# Patient Record
Sex: Female | Born: 1935 | Race: White | Hispanic: Yes | Marital: Single | Smoking: Never smoker
Health system: Southern US, Community
[De-identification: ages and names within clinical notes are randomized; demographics above are authoritative.]

## PROBLEM LIST (undated history)

## (undated) DIAGNOSIS — E119 Type 2 diabetes mellitus without complications: Secondary | ICD-10-CM

## (undated) DIAGNOSIS — D649 Anemia, unspecified: Secondary | ICD-10-CM

## (undated) DIAGNOSIS — M1990 Unspecified osteoarthritis, unspecified site: Secondary | ICD-10-CM

## (undated) DIAGNOSIS — J45909 Unspecified asthma, uncomplicated: Secondary | ICD-10-CM

## (undated) DIAGNOSIS — I10 Essential (primary) hypertension: Secondary | ICD-10-CM

## (undated) DIAGNOSIS — E079 Disorder of thyroid, unspecified: Secondary | ICD-10-CM

## (undated) DIAGNOSIS — Z8489 Family history of other specified conditions: Secondary | ICD-10-CM

## (undated) DIAGNOSIS — K8020 Calculus of gallbladder without cholecystitis without obstruction: Secondary | ICD-10-CM

## (undated) DIAGNOSIS — N289 Disorder of kidney and ureter, unspecified: Secondary | ICD-10-CM

## (undated) DIAGNOSIS — N200 Calculus of kidney: Secondary | ICD-10-CM

## (undated) HISTORY — PX: ABDOMINAL HYSTERECTOMY: SHX81

## (undated) HISTORY — PX: HERNIA REPAIR: SHX51

## (undated) HISTORY — PX: CATARACT EXTRACTION: SUR2

## (undated) HISTORY — PX: CHOLECYSTECTOMY: SHX55

---

## 1998-11-25 ENCOUNTER — Other Ambulatory Visit: Admission: RE | Admit: 1998-11-25 | Discharge: 1998-11-25 | Payer: Self-pay | Admitting: Endocrinology

## 2000-04-29 ENCOUNTER — Emergency Department (HOSPITAL_COMMUNITY): Admission: EM | Admit: 2000-04-29 | Discharge: 2000-04-30 | Payer: Self-pay | Admitting: Emergency Medicine

## 2000-04-30 ENCOUNTER — Encounter: Payer: Self-pay | Admitting: Emergency Medicine

## 2000-11-15 ENCOUNTER — Emergency Department (HOSPITAL_COMMUNITY): Admission: EM | Admit: 2000-11-15 | Discharge: 2000-11-15 | Payer: Self-pay | Admitting: Emergency Medicine

## 2001-01-17 ENCOUNTER — Encounter (INDEPENDENT_AMBULATORY_CARE_PROVIDER_SITE_OTHER): Payer: Self-pay | Admitting: Specialist

## 2001-01-17 ENCOUNTER — Ambulatory Visit (HOSPITAL_COMMUNITY): Admission: RE | Admit: 2001-01-17 | Discharge: 2001-01-18 | Payer: Self-pay | Admitting: Ophthalmology

## 2001-01-17 ENCOUNTER — Encounter: Payer: Self-pay | Admitting: Ophthalmology

## 2001-08-30 ENCOUNTER — Emergency Department (HOSPITAL_COMMUNITY): Admission: EM | Admit: 2001-08-30 | Discharge: 2001-08-30 | Payer: Self-pay | Admitting: Emergency Medicine

## 2001-08-30 ENCOUNTER — Encounter: Payer: Self-pay | Admitting: Emergency Medicine

## 2001-11-28 ENCOUNTER — Emergency Department (HOSPITAL_COMMUNITY): Admission: EM | Admit: 2001-11-28 | Discharge: 2001-11-28 | Payer: Self-pay

## 2001-12-17 ENCOUNTER — Emergency Department (HOSPITAL_COMMUNITY): Admission: EM | Admit: 2001-12-17 | Discharge: 2001-12-18 | Payer: Self-pay | Admitting: *Deleted

## 2002-11-27 ENCOUNTER — Encounter: Admission: RE | Admit: 2002-11-27 | Discharge: 2002-11-27 | Payer: Self-pay | Admitting: Endocrinology

## 2002-11-27 ENCOUNTER — Encounter: Payer: Self-pay | Admitting: Endocrinology

## 2003-04-26 ENCOUNTER — Ambulatory Visit (HOSPITAL_COMMUNITY): Admission: RE | Admit: 2003-04-26 | Discharge: 2003-04-26 | Payer: Self-pay | Admitting: General Surgery

## 2003-04-26 ENCOUNTER — Ambulatory Visit (HOSPITAL_BASED_OUTPATIENT_CLINIC_OR_DEPARTMENT_OTHER): Admission: RE | Admit: 2003-04-26 | Discharge: 2003-04-26 | Payer: Self-pay | Admitting: General Surgery

## 2003-07-17 ENCOUNTER — Emergency Department (HOSPITAL_COMMUNITY): Admission: EM | Admit: 2003-07-17 | Discharge: 2003-07-17 | Payer: Self-pay | Admitting: Emergency Medicine

## 2003-07-26 ENCOUNTER — Encounter (INDEPENDENT_AMBULATORY_CARE_PROVIDER_SITE_OTHER): Payer: Self-pay | Admitting: Specialist

## 2003-07-26 ENCOUNTER — Encounter: Admission: RE | Admit: 2003-07-26 | Discharge: 2003-07-26 | Payer: Self-pay | Admitting: Endocrinology

## 2003-09-30 ENCOUNTER — Encounter: Admission: RE | Admit: 2003-09-30 | Discharge: 2003-09-30 | Payer: Self-pay | Admitting: General Surgery

## 2003-10-01 ENCOUNTER — Ambulatory Visit (HOSPITAL_COMMUNITY): Admission: RE | Admit: 2003-10-01 | Discharge: 2003-10-01 | Payer: Self-pay | Admitting: General Surgery

## 2003-10-01 ENCOUNTER — Ambulatory Visit (HOSPITAL_BASED_OUTPATIENT_CLINIC_OR_DEPARTMENT_OTHER): Admission: RE | Admit: 2003-10-01 | Discharge: 2003-10-01 | Payer: Self-pay | Admitting: General Surgery

## 2003-10-01 ENCOUNTER — Encounter (INDEPENDENT_AMBULATORY_CARE_PROVIDER_SITE_OTHER): Payer: Self-pay | Admitting: *Deleted

## 2005-05-24 ENCOUNTER — Encounter: Admission: RE | Admit: 2005-05-24 | Discharge: 2005-05-24 | Payer: Self-pay | Admitting: Nephrology

## 2005-12-01 ENCOUNTER — Emergency Department (HOSPITAL_COMMUNITY): Admission: EM | Admit: 2005-12-01 | Discharge: 2005-12-02 | Payer: Self-pay | Admitting: Emergency Medicine

## 2009-01-23 ENCOUNTER — Emergency Department (HOSPITAL_COMMUNITY): Admission: EM | Admit: 2009-01-23 | Discharge: 2009-01-23 | Payer: Self-pay | Admitting: Emergency Medicine

## 2009-02-20 ENCOUNTER — Emergency Department (HOSPITAL_COMMUNITY): Admission: EM | Admit: 2009-02-20 | Discharge: 2009-02-20 | Payer: Self-pay | Admitting: Emergency Medicine

## 2010-08-01 ENCOUNTER — Encounter: Payer: Self-pay | Admitting: Orthopaedic Surgery

## 2010-08-02 ENCOUNTER — Encounter: Payer: Self-pay | Admitting: Orthopaedic Surgery

## 2010-08-04 ENCOUNTER — Emergency Department (HOSPITAL_COMMUNITY)
Admission: EM | Admit: 2010-08-04 | Discharge: 2010-08-04 | Payer: Self-pay | Source: Home / Self Care | Admitting: Emergency Medicine

## 2010-08-05 LAB — CBC
HCT: 36.7 % (ref 36.0–46.0)
Hemoglobin: 12.4 g/dL (ref 12.0–15.0)
MCH: 29.6 pg (ref 26.0–34.0)
MCHC: 33.8 g/dL (ref 30.0–36.0)
MCV: 87.6 fL (ref 78.0–100.0)
Platelets: 336 10*3/uL (ref 150–400)
RBC: 4.19 MIL/uL (ref 3.87–5.11)
RDW: 13.4 % (ref 11.5–15.5)
WBC: 15.1 10*3/uL — ABNORMAL HIGH (ref 4.0–10.5)

## 2010-08-05 LAB — URINALYSIS, ROUTINE W REFLEX MICROSCOPIC
Bilirubin Urine: NEGATIVE
Hgb urine dipstick: NEGATIVE
Ketones, ur: NEGATIVE mg/dL
Nitrite: NEGATIVE
Protein, ur: NEGATIVE mg/dL
Specific Gravity, Urine: 1.004 — ABNORMAL LOW (ref 1.005–1.030)
Urine Glucose, Fasting: NEGATIVE mg/dL
Urobilinogen, UA: 0.2 mg/dL (ref 0.0–1.0)
pH: 7 (ref 5.0–8.0)

## 2010-08-05 LAB — COMPREHENSIVE METABOLIC PANEL
ALT: 13 U/L (ref 0–35)
AST: 22 U/L (ref 0–37)
Albumin: 3.8 g/dL (ref 3.5–5.2)
Alkaline Phosphatase: 59 U/L (ref 39–117)
BUN: 34 mg/dL — ABNORMAL HIGH (ref 6–23)
CO2: 27 mEq/L (ref 19–32)
Calcium: 9.3 mg/dL (ref 8.4–10.5)
Chloride: 92 mEq/L — ABNORMAL LOW (ref 96–112)
Creatinine, Ser: 1.55 mg/dL — ABNORMAL HIGH (ref 0.4–1.2)
GFR calc Af Amer: 39 mL/min — ABNORMAL LOW (ref >60)
GFR calc non Af Amer: 32 mL/min — ABNORMAL LOW (ref >60)
Glucose, Bld: 146 mg/dL — ABNORMAL HIGH (ref 70–99)
Potassium: 4.2 mEq/L (ref 3.5–5.1)
Sodium: 129 mEq/L — ABNORMAL LOW (ref 135–145)
Total Bilirubin: 1 mg/dL (ref 0.3–1.2)
Total Protein: 7.4 g/dL (ref 6.0–8.3)

## 2010-08-05 LAB — URINE MICROSCOPIC-ADD ON

## 2010-08-05 LAB — DIFFERENTIAL
Basophils Absolute: 0 10*3/uL (ref 0.0–0.1)
Basophils Relative: 0 % (ref 0–1)
Eosinophils Absolute: 0.1 10*3/uL (ref 0.0–0.7)
Eosinophils Relative: 0 % (ref 0–5)
Lymphocytes Relative: 16 % (ref 12–46)
Lymphs Abs: 2.4 10*3/uL (ref 0.7–4.0)
Monocytes Absolute: 1.6 10*3/uL — ABNORMAL HIGH (ref 0.1–1.0)
Monocytes Relative: 11 % (ref 3–12)
Neutro Abs: 10.9 10*3/uL — ABNORMAL HIGH (ref 1.7–7.7)
Neutrophils Relative %: 72 % (ref 43–77)

## 2010-08-05 LAB — POCT CARDIAC MARKERS
CKMB, poc: <1 ng/mL — ABNORMAL LOW (ref 1.0–8.0)
Myoglobin, poc: 201 ng/mL (ref 12–200)
Troponin i, poc: 0.1 ng/mL — ABNORMAL HIGH (ref 0.00–0.09)

## 2010-08-05 LAB — CK TOTAL AND CKMB (NOT AT ARMC)
CK, MB: 1.8 ng/mL (ref 0.3–4.0)
Relative Index: INVALID (ref 0.0–2.5)
Total CK: 69 U/L (ref 7–177)

## 2010-08-05 LAB — LIPASE, BLOOD: Lipase: 20 U/L (ref 11–59)

## 2010-08-05 LAB — TROPONIN I: Troponin I: 0.02 ng/mL (ref 0.00–0.06)

## 2010-10-17 LAB — COMPREHENSIVE METABOLIC PANEL
ALT: 9 U/L (ref 0–35)
AST: 42 U/L — ABNORMAL HIGH (ref 0–37)
Albumin: 4 g/dL (ref 3.5–5.2)
Alkaline Phosphatase: 58 U/L (ref 39–117)
BUN: 27 mg/dL — ABNORMAL HIGH (ref 6–23)
CO2: 26 mEq/L (ref 19–32)
Calcium: 8.8 mg/dL (ref 8.4–10.5)
Chloride: 99 mEq/L (ref 96–112)
Creatinine, Ser: 1.15 mg/dL (ref 0.4–1.2)
GFR calc Af Amer: 55 mL/min — ABNORMAL LOW (ref >60)
GFR calc non Af Amer: 45 mL/min — ABNORMAL LOW (ref >60)
Glucose, Bld: 195 mg/dL — ABNORMAL HIGH (ref 70–99)
Potassium: 4.1 mEq/L (ref 3.5–5.1)
Sodium: 135 mEq/L (ref 135–145)
Total Bilirubin: 1.5 mg/dL — ABNORMAL HIGH (ref 0.3–1.2)
Total Protein: 7.3 g/dL (ref 6.0–8.3)

## 2010-10-17 LAB — URINE MICROSCOPIC-ADD ON

## 2010-10-17 LAB — DIFFERENTIAL
Basophils Absolute: 0 10*3/uL (ref 0.0–0.1)
Basophils Relative: 0 % (ref 0–1)
Eosinophils Absolute: 0.1 10*3/uL (ref 0.0–0.7)
Eosinophils Relative: 1 % (ref 0–5)
Lymphocytes Relative: 7 % — ABNORMAL LOW (ref 12–46)
Lymphs Abs: 0.7 10*3/uL (ref 0.7–4.0)
Monocytes Absolute: 0.7 10*3/uL (ref 0.1–1.0)
Monocytes Relative: 7 % (ref 3–12)
Neutro Abs: 8.8 10*3/uL — ABNORMAL HIGH (ref 1.7–7.7)
Neutrophils Relative %: 85 % — ABNORMAL HIGH (ref 43–77)

## 2010-10-17 LAB — URINALYSIS, ROUTINE W REFLEX MICROSCOPIC
Bilirubin Urine: NEGATIVE
Glucose, UA: NEGATIVE mg/dL
Ketones, ur: NEGATIVE mg/dL
Nitrite: NEGATIVE
Protein, ur: 100 mg/dL — AB
Specific Gravity, Urine: 1.014 (ref 1.005–1.030)
Urobilinogen, UA: 0.2 mg/dL (ref 0.0–1.0)
pH: 6 (ref 5.0–8.0)

## 2010-10-17 LAB — URINE CULTURE: Colony Count: >=100000

## 2010-10-17 LAB — CBC
HCT: 37.8 % (ref 36.0–46.0)
Hemoglobin: 12.8 g/dL (ref 12.0–15.0)
MCHC: 33.8 g/dL (ref 30.0–36.0)
MCV: 88.2 fL (ref 78.0–100.0)
Platelets: 351 10*3/uL (ref 150–400)
RBC: 4.29 MIL/uL (ref 3.87–5.11)
RDW: 14.5 % (ref 11.5–15.5)
WBC: 10.3 10*3/uL (ref 4.0–10.5)

## 2010-10-17 LAB — LIPASE, BLOOD: Lipase: 94 U/L — ABNORMAL HIGH (ref 11–59)

## 2010-10-17 LAB — GLUCOSE, CAPILLARY: Glucose-Capillary: 175 mg/dL — ABNORMAL HIGH (ref 70–99)

## 2010-10-18 LAB — URINALYSIS, ROUTINE W REFLEX MICROSCOPIC
Bilirubin Urine: NEGATIVE
Glucose, UA: NEGATIVE mg/dL
Ketones, ur: NEGATIVE mg/dL
pH: 7 (ref 5.0–8.0)

## 2010-10-18 LAB — CBC
MCV: 89.9 fL (ref 78.0–100.0)
Platelets: 332 10*3/uL (ref 150–400)
WBC: 7.9 10*3/uL (ref 4.0–10.5)

## 2010-10-18 LAB — COMPREHENSIVE METABOLIC PANEL
ALT: 14 U/L (ref 0–35)
AST: 23 U/L (ref 0–37)
Albumin: 3.8 g/dL (ref 3.5–5.2)
Chloride: 104 mEq/L (ref 96–112)
Creatinine, Ser: 1.17 mg/dL (ref 0.4–1.2)
GFR calc Af Amer: 54 mL/min — ABNORMAL LOW (ref >60)
Potassium: 4.4 mEq/L (ref 3.5–5.1)
Sodium: 139 mEq/L (ref 135–145)
Total Bilirubin: 0.5 mg/dL (ref 0.3–1.2)

## 2010-10-18 LAB — DIFFERENTIAL
Basophils Absolute: 0.1 10*3/uL (ref 0.0–0.1)
Eosinophils Relative: 3 % (ref 0–5)
Lymphocytes Relative: 32 % (ref 12–46)
Lymphs Abs: 2.5 10*3/uL (ref 0.7–4.0)
Monocytes Absolute: 0.6 10*3/uL (ref 0.1–1.0)

## 2010-10-18 LAB — URINE MICROSCOPIC-ADD ON

## 2010-10-18 LAB — GLUCOSE, CAPILLARY: Glucose-Capillary: 97 mg/dL (ref 70–99)

## 2010-11-27 NOTE — Op Note (Signed)
[facility]  Patient:    Janet [name], Janet [name]                      MRN: [id number] Proc. Date: 01/17/01 Adm. Date:  [id number] Attending:  Bertrum Sol                           Operative Report  DATE OF BIRTH:  1923-10-05.  PREOPERATIVE DIAGNOSIS:  Proliferative diabetic retinopathy with preretinal fibrosis.  PROCEDURES:  Pars plana vitrectomy, capsulectomy, retinal photocoagulation, membrane peel, left eye.  SURGEON:  Beulah Gandy. Ashley Royalty, M.D.  ASSISTANT:  _____, R.R.N.  ANESTHESIA:  General.  DESCRIPTION OF PROCEDURE:  Usual prep and drape.  Peritomies at 10, 2, and 4 oclock.  A 4 mm angled infusion port anchored into place at 4 oclock.  The lighted pick and the cutter were placed at 10 and 2 oclock, respectively. The contact lens ring was anchored into place at 6 and 12 oclock.  The pars plana vitrectomy was begun just behind the pseudophakos.  There was fibrosis of the posterior capsule, and therefore it was removed with the vitreous cutter knife.  The vitrectomy was carried posteriorly, and a large amount of vitreous was removed from the central vitreous cavity.  The vitrectomy was carried out of the far peripheral vitreous cavity with the 30 degree prismatic lens, and all vitreous was removed down to the vitreous base for 360 degrees. The attention was then carried to the macular region, where under high magnification the 27 gauge Eagle pick was used to engage a thick, white layer of posterior hyaloid and preretinal fibrosis.  This layer was lifted up and peeled in one continuous sheet across the entire macula and over the disc.  It was peeled out to the equator.  The membrane was removed from the eye and sent to pathology for study.  The Endolaser was positioned in the eye, and 912 burns were placed around the retinal periphery with a power of 500 milliwatts, 1000 microns each, and 0.1 seconds each.  A wash-out procedure was  performed. The instruments were removed from the eye, and 9-0 nylon was used to close the sclerotomy sites.  The conjunctiva was closed with wet-field cautery. Polymyxin and gentamicin were irrigated into Tenons space, atropine solution was applied, Marcaine was injected around the globe for postop pain, Decadron 10 mg was injected into the lower subconjunctival space.  Polysporin, a patch, and shield were placed.  The closing tension was less than 10 with a Barraquer tonometer.  COMPLICATIONS:  None.  DURATION:  One hour.  The patient was awakened and taken to recovery in satisfactory condition. DD:  01/17/01 TD:  01/17/01 Job: [id number]

## 2010-11-27 NOTE — Op Note (Signed)
NAMEVERNIA, TEEM                         ACCOUNT NO.:  [id number]   MEDICAL RECORD NO.:  [id number]                   PATIENT TYPE:  AMB   LOCATION:  DSC                                  FACILITY:  [facility]   PHYSICIAN:  Sharlet Salina T. Hoxworth, M.D.          DATE OF BIRTH:  17-Dec-1923   DATE OF PROCEDURE:  10/01/2003  DATE OF DISCHARGE:                                 OPERATIVE REPORT   PREOPERATIVE DIAGNOSIS:  Left breast mass.   POSTOPERATIVE DIAGNOSIS:  Left breast mass.   PROCEDURE:  Left breast biopsy.   SURGEON:  Lorne Skeens. Hoxworth, M.D.   ANESTHESIA:  Local with IV sedation.   HISTORY OF PRESENT ILLNESS:  Ms. Harrell is a 77 year old female who presented  with a palpable mass in the upper left breast.  She has had a large core  needle biopsy showing only fibrotic change but the mass has enlarged on  clinical follow up and excision has been recommended and accepted.  The  nature of the procedure, indications, risks of bleeding, infection, were  discussed and understood.  She is now brought to the operating room for this  procedure.   DESCRIPTION OF PROCEDURE:  The patient was brought to the operating room and  placed in supine position on the operating table and IV sedation was  administered.  The left breast was sterilely prepped and draped.  Local  anesthesia was used to infiltrate the skin and underlying breast tissue.  A  curvilinear incision was made directly over the mass at the 12 o'clock  position and dissection was carried down through the subcutaneous tissue to  the breast capsule.  The area of palpable abnormality which was quite firm  measuring about 2 cm in diameter was then completely sharply excised.  This  was sent for permanent pathology.  Hemostasis was obtained with the cautery.  The subcu was reapproximated with interrupted 4-0 Monocryl and the skin with  running subcuticular 4-0 Monocryl and Steri-Strips.  Sponge, needle, and  instrument counts were  correct.  Dry, sterile dressings were applied.  The  patient was taken to the recovery room in good condition.                                               Lorne Skeens. Hoxworth, M.D.    Tory Emerald  D:  10/01/2003  T:  10/01/2003  Job:  [id number]

## 2010-12-29 ENCOUNTER — Observation Stay (HOSPITAL_COMMUNITY)
Admission: AD | Admit: 2010-12-29 | Discharge: 2010-12-30 | Disposition: A | Discharge status: Home / Self Care | Payer: Medicare PPO | Source: Ambulatory Visit | Attending: Internal Medicine | Admitting: Internal Medicine

## 2010-12-29 ENCOUNTER — Inpatient Hospital Stay (HOSPITAL_COMMUNITY): Payer: Medicare PPO

## 2010-12-29 DIAGNOSIS — Z23 Encounter for immunization: Secondary | ICD-10-CM | POA: Insufficient documentation

## 2010-12-29 DIAGNOSIS — I10 Essential (primary) hypertension: Secondary | ICD-10-CM | POA: Insufficient documentation

## 2010-12-29 DIAGNOSIS — G4733 Obstructive sleep apnea (adult) (pediatric): Secondary | ICD-10-CM | POA: Insufficient documentation

## 2010-12-29 DIAGNOSIS — R079 Chest pain, unspecified: Principal | ICD-10-CM | POA: Insufficient documentation

## 2010-12-29 DIAGNOSIS — Z8249 Family history of ischemic heart disease and other diseases of the circulatory system: Secondary | ICD-10-CM | POA: Insufficient documentation

## 2010-12-29 DIAGNOSIS — D509 Iron deficiency anemia, unspecified: Secondary | ICD-10-CM | POA: Insufficient documentation

## 2010-12-29 DIAGNOSIS — E119 Type 2 diabetes mellitus without complications: Secondary | ICD-10-CM | POA: Insufficient documentation

## 2010-12-29 DIAGNOSIS — I498 Other specified cardiac arrhythmias: Secondary | ICD-10-CM | POA: Insufficient documentation

## 2010-12-29 DIAGNOSIS — Z9089 Acquired absence of other organs: Secondary | ICD-10-CM | POA: Insufficient documentation

## 2010-12-29 DIAGNOSIS — R0602 Shortness of breath: Secondary | ICD-10-CM | POA: Insufficient documentation

## 2010-12-29 DIAGNOSIS — F411 Generalized anxiety disorder: Secondary | ICD-10-CM | POA: Insufficient documentation

## 2010-12-29 DIAGNOSIS — E039 Hypothyroidism, unspecified: Secondary | ICD-10-CM | POA: Insufficient documentation

## 2010-12-29 LAB — COMPREHENSIVE METABOLIC PANEL
ALT: 11 U/L (ref 0–35)
AST: 18 U/L (ref 0–37)
CO2: 29 mEq/L (ref 19–32)
Chloride: 99 mEq/L (ref 96–112)
GFR calc non Af Amer: 37 mL/min — ABNORMAL LOW (ref >60)
Sodium: 137 mEq/L (ref 135–145)
Total Bilirubin: 0.2 mg/dL — ABNORMAL LOW (ref 0.3–1.2)

## 2010-12-29 LAB — CBC
HCT: 36.9 % (ref 36.0–46.0)
Hemoglobin: 11.9 g/dL — ABNORMAL LOW (ref 12.0–15.0)
MCH: 28.3 pg (ref 26.0–34.0)
MCHC: 32.2 g/dL (ref 30.0–36.0)

## 2010-12-29 LAB — CARDIAC PANEL(CRET KIN+CKTOT+MB+TROPI): Relative Index: INVALID (ref 0.0–2.5)

## 2010-12-30 LAB — LIPID PANEL
Cholesterol: 166 mg/dL (ref 0–200)
Triglycerides: 104 mg/dL (ref <150)
VLDL: 21 mg/dL (ref 0–40)

## 2010-12-30 LAB — COMPREHENSIVE METABOLIC PANEL
ALT: 10 U/L (ref 0–35)
Calcium: 9.9 mg/dL (ref 8.4–10.5)
Creatinine, Ser: 1.26 mg/dL — ABNORMAL HIGH (ref 0.50–1.10)
GFR calc Af Amer: 49 mL/min — ABNORMAL LOW (ref >60)
Glucose, Bld: 112 mg/dL — ABNORMAL HIGH (ref 70–99)
Sodium: 138 mEq/L (ref 135–145)
Total Protein: 7.3 g/dL (ref 6.0–8.3)

## 2010-12-30 LAB — CARDIAC PANEL(CRET KIN+CKTOT+MB+TROPI)
CK, MB: 2.2 ng/mL (ref 0.3–4.0)
Relative Index: INVALID (ref 0.0–2.5)
Total CK: 70 U/L (ref 7–177)

## 2010-12-30 LAB — URINALYSIS, ROUTINE W REFLEX MICROSCOPIC
Bilirubin Urine: NEGATIVE
Hgb urine dipstick: NEGATIVE
Nitrite: NEGATIVE
Specific Gravity, Urine: 1.006 (ref 1.005–1.030)
pH: 7.5 (ref 5.0–8.0)

## 2010-12-30 LAB — HEMOGLOBIN A1C
Hgb A1c MFr Bld: 7.1 % — ABNORMAL HIGH (ref <5.7)
Mean Plasma Glucose: 157 mg/dL — ABNORMAL HIGH (ref <117)

## 2010-12-30 LAB — TSH: TSH: 1.22 u[IU]/mL (ref 0.350–4.500)

## 2010-12-30 LAB — MAGNESIUM: Magnesium: 2 mg/dL (ref 1.5–2.5)

## 2010-12-30 LAB — PHOSPHORUS: Phosphorus: 3.4 mg/dL (ref 2.3–4.6)

## 2010-12-30 LAB — GLUCOSE, CAPILLARY

## 2011-01-04 NOTE — H&P (Signed)
Janet [name], Janet [name] NO.:  [id number]  MEDICAL RECORD NO.:  [id number]  LOCATION:  1433                         FACILITY:  [facility]  PHYSICIAN:  Kathlen Mody, MD       DATE OF BIRTH:  [birth year]  DATE OF ADMISSION:  12/29/2010 DATE OF DISCHARGE:                             HISTORY & PHYSICAL   PRIMARY CARE PHYSICIAN:  Jackie Plum, MD  CHIEF COMPLAINT:  Chest pain at 4:30 p.m. today.  HISTORY OF PRESENT ILLNESS:  This is an 77 year old pleasant elderly lady with history of hypertension, diabetes, hypothyroidism, obstructive sleep apnea, came to Dr. Rubye Oaks office complaining of left-sided precordial chest pain, sharp, radiating to the back associated with little bit of shortness of breath.  Chest pain lasted for about an hour and it resolved by itself.  It is not related to any activity.  The patient also has a history of heartburn and on over-the-counter NSAIDs and tramadol for arthritis pain.  The patient denies any nausea, diaphoresis when she had the chest pain and denies any vomiting, abdominal pain, diarrhea.  She denies any palpitations, syncope.  Her daughter at the bedside stated that she is stressed out and she has anxiety and at this time she is stressed out, has some family stress going on at this time.  The patient also reports to feeling depressed because of the family, personal distress.  No history of suicidal attempts and no history of suicidal ideation at this time.  Denies any urinary complaints.  Has occasional constipation.  Denies any headache or blurry vision.  Denies any weakness or tingling or numbness anywhere in her body.  The patient denies any fever or cough.  REVIEW OF SYSTEMS:  See HPI, otherwise negative.  PAST MEDICAL HISTORY:  Hypertension, hypothyroidism, type 2 diabetes, iron-deficiency anemia, arthritis, obstructive sleep apnea, not on CPAP at this time, and anxiety.  PAST SURGICAL HISTORY:  Has  cholecystectomy done, has cataract surgery and corneal transplant on the right side.  FAMILY HISTORY:  History of coronary artery disease in the mother.  HOME MEDICATIONS:  Please see med recon for detailed meds and their doses.  ALLERGIES:  The patient is allergic to generic form of SYNTHROID and GLUCOTROL.  PHYSICAL EXAMINATION:  VITAL SIGNS:  The patient's vitals at this time, she is afebrile, pulse of 67, respirations 18, blood pressure 159/66, saturating 95% on room air. GENERAL:  On exam, she is alert, afebrile, oriented x3, comfortable, in no acute distress and chest pain has resolved. HEENT EXAM:  Pupils reacting to light.  Moist mucous membranes.  No JVD. No scleral icterus. CARDIOVASCULAR EXAM:  S1, S2 heard.  Regular rate and rhythm. RESPIRATORY EXAM:  Chest clear to auscultation bilaterally.  No wheezing or rhonchi. ABDOMEN:  Soft, nontender, nondistended.  Bowel sounds are present. EXTREMITIES:  No pedal edema, clubbing or cyanosis. NEUROLOGICAL EXAM:  The patient is able to walk to the bathroom and back.  No sensory deficits.  The patient appears to be little bit depressed.  No suicidal ideations or attempts.  LABORATORY DATA:  Her labs are pending.  RADIOLOGY:  Pending.  ASSESSMENT AND PLAN:  This is an 77 year old lady with past medical  history of hypertension, diabetes, hypothyroidism, admitted for precordial chest pain that lasted for an hour, associated with some anxiety, some shortness of breath.  Has a history of heartburn:  She is directly admitted from Dr. Rubye Oaks office to rule out ACS.  We will get cardiac enzymes q.6 h x3.  Will get a stat EKG.  The tele monitoring is at this time normal sinus rhythm.  Will get a 2-D echocardiogram in the morning.  Will stop the Advil and tramadol.  Would start her on Protonix p.o. 40 mg daily.  Will also give her Xanax 0.25 mg p.r.n. for anxiety.  Diabetes.  Will get a hemoglobin A1c.  Hold oral hypoglycemics  while she is in the hospital, put her on sliding scale insulin with sensitive scale coverage.  Hypertension.  Blood pressure is slightly on the upper side.  Will continue her home medications at this time.  Hypothyroidism.  Will get a TSH level and continue with her Synthroid for now.  Obstructive sleep apnea.  She is not using CPAP at this time.  Iron-deficiency anemia.  Will continue with her supplements.  DVT prophylaxis.  Subcutaneous Lovenox.  GI prophylaxis PPI.  The patient is full code.  Depression.  The patient slightly depressed.  Denies any suicidal ideation or suicidal attempts.  Will call psychiatric consult in the morning.  The patient is full code.          ______________________________ Kathlen Mody, MD     [facility]/MED[facility]  D:  12/29/2010  T:  12/29/2010  Job:  [id number]  Electronically Signed by Kathlen Mody MD on 01/04/2011 02:28:52 AM

## 2011-01-06 NOTE — Discharge Summary (Signed)
Janet [name], [name] NO.:  [id number]  MEDICAL RECORD NO.:  [id number]  LOCATION:  1433                         FACILITY:  [facility]  PHYSICIAN:  Peggye Pitt, M.D. DATE OF BIRTH:  June 06, 1924  DATE OF ADMISSION:  12/29/2010 DATE OF DISCHARGE:  12/30/2010                              DISCHARGE SUMMARY   PRIMARY CARE PHYSICIAN:  The patient's primary care physician is Dr. Greggory Stallion Osei-Bonsu.  DISCHARGE DIAGNOSES: 1. Chest pain, ruled out for acute coronary syndrome, likely secondary     to gastroesophageal reflux disease. 2. Hypertension. 3. Hypothyroidism. 4. Type 2 diabetes mellitus. 5. Iron-deficiency anemia. 6. Anxiety disorder. 7. Obstructive sleep apnea, not on CPAP.  DISCHARGE MEDICATIONS:  Include: 1. Xanax 0.25 mg daily as needed for anxiety.  I have given her 15     tablets. 2. Protonix 40 mg daily which she should take for at least 12 weeks. 3. Ferrous sulfate 325 mg daily. 4. Fish oil 1000 mg daily. 5. Glucotrol XL 5 mg to take 2 tablets in the morning and 1 in the     evening. 6. Lisinopril/hydrochlorothiazide 10/12.5 mg 1 tablet daily. 7. Synthroid 50 mcg daily. 8. Tramadol 50 one tablet twice daily as needed for pain.  She has been instructed to stop taking her Advil.  DISPOSITION AND FOLLOWUP:  Janet [name] [name] will be discharged home today in stable and improved condition.  She is to follow up with her PCP in 3 to 4 weeks for followup on her anxiety and on her GERD.  CONSULTATION THIS HOSPITALIZATION:  None.  IMAGES AND PROCEDURES:  Include a chest x-ray on December 29, 2010, that showed no active disease.  HISTORY AND PHYSICAL:  For full details, please see dictation by Dr. Blake Divine on December 29, 2010; but in brief, Janet [name] [name] is an 77 year old lady who presented to the hospital from her primary care physician's office with complaint of chest pain.  Because of this, she was admitted to our service.  HOSPITAL COURSE BY PROBLEM: 1. Chest pain.   This seems more GI related.  The pain is worse when     lying down flat and after eating.  She also relates a sour taste in     her mouth.  Chest pain has improved with one dose of Protonix     received in the hospital.  I will send her home with a prescription     for Protonix 40 mg daily which I would like her to take for at     least 12 weeks.  Continued necessity of Protonix will be determined     by her PCP.  She is ruled out for acute coronary syndrome with     negative EKG and negative cardiac enzymes. 2. Anxiety disorder.  It is unclear to me as to whether she truly has     a diagnosis of anxiety disorder.  She certainly has a lot of social     stressors at the moment.  I will give her Xanax 0.25 mg, #15     tablets.  Her PCP will determine whether she has necessity for some     other anxiety medications versus referral to psychiatry if  needed.  Rest of chronic conditions have been stable.  Vitals on day of discharge, blood pressure 150/57, heart rate 57, respirations 18, sats of 95% on room air, and temperature of 97.8.   Peggye Pitt, M.D.     EH/MEDQ  D:  12/30/2010  T:  12/30/2010  Job:  [id number]  cc:   Jackie Plum, M.D. Fax: [phone]  Electronically Signed by Peggye Pitt M.D. on 01/06/2011 07:47:05 PM

## 2011-01-13 ENCOUNTER — Emergency Department (HOSPITAL_COMMUNITY): Payer: No Typology Code available for payment source

## 2011-01-13 ENCOUNTER — Emergency Department (HOSPITAL_COMMUNITY)
Admission: EM | Admit: 2011-01-13 | Discharge: 2011-01-14 | Disposition: A | Discharge status: Home / Self Care | Payer: No Typology Code available for payment source | Attending: Emergency Medicine | Admitting: Emergency Medicine

## 2011-01-13 DIAGNOSIS — I76 Septic arterial embolism: Secondary | ICD-10-CM | POA: Insufficient documentation

## 2011-01-13 DIAGNOSIS — R0789 Other chest pain: Secondary | ICD-10-CM | POA: Insufficient documentation

## 2011-01-13 DIAGNOSIS — M069 Rheumatoid arthritis, unspecified: Secondary | ICD-10-CM | POA: Insufficient documentation

## 2011-01-13 DIAGNOSIS — E119 Type 2 diabetes mellitus without complications: Secondary | ICD-10-CM | POA: Insufficient documentation

## 2011-01-13 DIAGNOSIS — M546 Pain in thoracic spine: Secondary | ICD-10-CM | POA: Insufficient documentation

## 2011-01-13 DIAGNOSIS — I10 Essential (primary) hypertension: Secondary | ICD-10-CM | POA: Insufficient documentation

## 2011-01-13 LAB — GLUCOSE, CAPILLARY

## 2011-04-15 ENCOUNTER — Other Ambulatory Visit: Payer: Self-pay | Admitting: Specialist

## 2011-04-15 DIAGNOSIS — E039 Hypothyroidism, unspecified: Secondary | ICD-10-CM

## 2011-04-22 ENCOUNTER — Ambulatory Visit
Admission: RE | Admit: 2011-04-22 | Discharge: 2011-04-22 | Disposition: A | Discharge status: Home / Self Care | Payer: Medicare PPO | Source: Ambulatory Visit | Attending: Specialist | Admitting: Specialist

## 2011-04-22 ENCOUNTER — Other Ambulatory Visit: Payer: Medicare PPO

## 2011-04-22 DIAGNOSIS — E039 Hypothyroidism, unspecified: Secondary | ICD-10-CM

## 2011-06-28 ENCOUNTER — Ambulatory Visit (HOSPITAL_COMMUNITY)
Admission: RE | Admit: 2011-06-28 | Discharge: 2011-06-28 | Disposition: A | Discharge status: Home / Self Care | Payer: Medicare PPO | Source: Ambulatory Visit | Attending: Specialist | Admitting: Specialist

## 2011-06-28 DIAGNOSIS — M79609 Pain in unspecified limb: Secondary | ICD-10-CM | POA: Insufficient documentation

## 2011-06-28 DIAGNOSIS — R609 Edema, unspecified: Secondary | ICD-10-CM

## 2011-06-28 DIAGNOSIS — M7989 Other specified soft tissue disorders: Secondary | ICD-10-CM | POA: Insufficient documentation

## 2011-06-28 NOTE — Progress Notes (Signed)
*  PRELIMINARY RESULTS*  RLEV Duplex has been performed.  No obvious evidence of deep vein thrombosis of the right lower extremity. No evidence of a Baker's Cyst on the right.  Janet [name] 06/28/2011, 2:58 PM

## 2011-12-31 ENCOUNTER — Encounter (HOSPITAL_COMMUNITY): Payer: Self-pay | Admitting: *Deleted

## 2011-12-31 ENCOUNTER — Emergency Department (HOSPITAL_COMMUNITY)
Admission: EM | Admit: 2011-12-31 | Discharge: 2011-12-31 | Disposition: A | Discharge status: Home / Self Care | Payer: Medicare PPO | Attending: Emergency Medicine | Admitting: Emergency Medicine

## 2011-12-31 DIAGNOSIS — Z79899 Other long term (current) drug therapy: Secondary | ICD-10-CM | POA: Insufficient documentation

## 2011-12-31 DIAGNOSIS — G8929 Other chronic pain: Secondary | ICD-10-CM | POA: Insufficient documentation

## 2011-12-31 DIAGNOSIS — M129 Arthropathy, unspecified: Secondary | ICD-10-CM | POA: Insufficient documentation

## 2011-12-31 DIAGNOSIS — I10 Essential (primary) hypertension: Secondary | ICD-10-CM | POA: Insufficient documentation

## 2011-12-31 DIAGNOSIS — E119 Type 2 diabetes mellitus without complications: Secondary | ICD-10-CM | POA: Insufficient documentation

## 2011-12-31 DIAGNOSIS — M25569 Pain in unspecified knee: Secondary | ICD-10-CM | POA: Insufficient documentation

## 2011-12-31 HISTORY — DX: Essential (primary) hypertension: I10

## 2011-12-31 HISTORY — DX: Anemia, unspecified: D64.9

## 2011-12-31 HISTORY — DX: Disorder of thyroid, unspecified: E07.9

## 2011-12-31 HISTORY — DX: Unspecified osteoarthritis, unspecified site: M19.90

## 2011-12-31 MED ORDER — OXYCODONE-ACETAMINOPHEN 5-325 MG PO TABS: 1.0000 | ORAL_TABLET | ORAL | Status: AC | PRN

## 2011-12-31 MED ORDER — HYDROCODONE-ACETAMINOPHEN 5-325 MG PO TABS: 1.0000 | ORAL_TABLET | Freq: Four times a day (QID) | ORAL | Status: AC | PRN

## 2011-12-31 MED ORDER — HYDROMORPHONE HCL PF 2 MG/ML IJ SOLN
1.0000 mg | Freq: Once | INTRAMUSCULAR | Status: AC
  Administered 2011-12-31: 1 mg via INTRAMUSCULAR

## 2011-12-31 NOTE — ED Provider Notes (Signed)
History     CSN: [id number]  Arrival date & time 12/31/11  1751   First MD Initiated Contact with Patient 12/31/11 1850     7:46 PM HPI Patient reports chronic knee pain since 2003. States that the pain is gradually worsening and is not being relieved with tramadol or knee injections. Reports she follows up with [facility] orthopedics. Denies new injury to knee. States that pain is worsening and she is unable to use her cane and has had to resort to using her walker since she feels unstable when walking. Has been told she has arthritis in her knee Patient is a 78 y.o. female presenting with knee pain. The history is provided by the patient.  Knee Pain This is a chronic problem. Episode onset: several years. The problem occurs constantly. The problem has been gradually worsening. Pertinent negatives include no joint swelling, nausea, numbness, vomiting or weakness. The symptoms are aggravated by standing and walking. Treatments tried: tramadol. The treatment provided mild relief.    Past Medical History  Diagnosis Date  . Diabetes mellitus   . Arthritis   . Hypertension   . Thyroid disease   . Anemia     History reviewed. No pertinent past surgical history.  History reviewed. No pertinent family history.  History  Substance Use Topics  . Smoking status: Not on file  . Smokeless tobacco: Not on file  . Alcohol Use: No    OB History    Grav Para Term Preterm Abortions TAB SAB Ect Mult Living                  Review of Systems  Gastrointestinal: Negative for nausea and vomiting.  Musculoskeletal: Negative for joint swelling.       Knee pain  Neurological: Negative for weakness and numbness.  All other systems reviewed and are negative.    Allergies  Other  Home Medications   Current Outpatient Rx  Name Route Sig Dispense Refill  . GLIPIZIDE ER 5 MG PO TB24 Oral Take 5-10 mg by mouth See admin instructions. Takes 2 tablets in the morning and 1 tablet in the evening     . LEVOTHYROXINE SODIUM 50 MCG PO TABS Oral Take 50 mcg by mouth daily.    Marland Kitchen LISINOPRIL-HYDROCHLOROTHIAZIDE 10-12.5 MG PO TABS Oral Take 1 tablet by mouth daily.    Marland Kitchen PIOGLITAZONE HCL 15 MG PO TABS Oral Take 15 mg by mouth daily.    . TRAMADOL HCL 50 MG PO TABS Oral Take 50 mg by mouth every 6 (six) hours as needed. pain      BP 154/62  Pulse 65  Temp 98.7 F (37.1 C) (Oral)  Resp 20  SpO2 100%  Physical Exam  Vitals reviewed. Constitutional: She is oriented to person, place, and time. Vital signs are normal. She appears well-developed and well-nourished. No distress.  HENT:  Head: Normocephalic and atraumatic.  Eyes: Pupils are equal, round, and reactive to light.  Neck: Neck supple.  Pulmonary/Chest: Effort normal.  Musculoskeletal:       Right knee: She exhibits normal range of motion, no swelling, no effusion, no ecchymosis, normal alignment, no LCL laxity, normal patellar mobility, normal meniscus and no MCL laxity. tenderness found. Medial joint line and lateral joint line tenderness noted. No MCL, no LCL and no patellar tendon tenderness noted.       Right knee: Negative anterior/posterior drawer, negative valgus and varus. No crepitus. Tender palpation along medial joint line.   Neurological: She is  alert and oriented to person, place, and time.  Skin: Skin is warm and dry. No rash noted. No erythema. No pallor.  Psychiatric: She has a normal mood and affect. Her behavior is normal.    ED Course  Procedures   MDM    Patient is a new injury today. Likely pain is chronic due to arthritis of the knee. Advise close followup with orthopedic physician for further evaluation and treatment if needed. Will prescribe patient hydrocodone. Patient voices understanding and is ready for discharge     Thomasene Lot, Cordelia Poche 12/31/11 2027

## 2011-12-31 NOTE — ED Notes (Addendum)
Pt c/o right knee pain, states in 2003 she broke her right ankle and put in a cast up to her thigh and has been having trouble with knee since then. Pt denies recent injury. States she has seen orthopedic dr in February/march for same complaint with no diagnosis. States she has received steroid shots in knee that helps but is unable to keep getting it due to diabetes. Pt has been evaluated multiple times for same complaint. States pain is now to "high"

## 2011-12-31 NOTE — Discharge Instructions (Signed)
Knee Pain The knee is the complex joint between your thigh and your lower leg. It is made up of bones, tendons, ligaments, and cartilage. The bones that make up the knee are:  The femur in the thigh.   The tibia and fibula in the lower leg.   The patella or kneecap riding in the groove on the lower femur.  CAUSES  Knee pain is a common complaint with many causes. A few of these causes are:  Injury, such as:   A ruptured ligament or tendon injury.   Torn cartilage.   Medical conditions, such as:   Gout   Arthritis   Infections   Overuse, over training or overdoing a physical activity.  Knee pain can be minor or severe. Knee pain can accompany debilitating injury. Minor knee problems often respond well to self-care measures or get well on their own. More serious injuries may need medical intervention or even surgery. SYMPTOMS The knee is complex. Symptoms of knee problems can vary widely. Some of the problems are:  Pain with movement and weight bearing.   Swelling and tenderness.   Buckling of the knee.   Inability to straighten or extend your knee.   Your knee locks and you cannot straighten it.   Warmth and redness with pain and fever.   Deformity or dislocation of the kneecap.  DIAGNOSIS  Determining what is wrong may be very straight forward such as when there is an injury. It can also be challenging because of the complexity of the knee. Tests to make a diagnosis may include:  Your caregiver taking a history and doing a physical exam.   Routine X-rays can be used to rule out other problems. X-rays will not reveal a cartilage tear. Some injuries of the knee can be diagnosed by:   Arthroscopy a surgical technique by which a small video camera is inserted through tiny incisions on the sides of the knee. This procedure is used to examine and repair internal knee joint problems. Tiny instruments can be used during arthroscopy to repair the torn knee cartilage  (meniscus).   Arthrography is a radiology technique. A contrast liquid is directly injected into the knee joint. Internal structures of the knee joint then become visible on X-ray film.   An MRI scan is a non x-ray radiology procedure in which magnetic fields and a computer produce two- or three-dimensional images of the inside of the knee. Cartilage tears are often visible using an MRI scanner. MRI scans have largely replaced arthrography in diagnosing cartilage tears of the knee.   Blood work.   Examination of the fluid that helps to lubricate the knee joint (synovial fluid). This is done by taking a sample out using a needle and a syringe.  TREATMENT The treatment of knee problems depends on the cause. Some of these treatments are:  Depending on the injury, proper casting, splinting, surgery or physical therapy care will be needed.   Give yourself adequate recovery time. Do not overuse your joints. If you begin to get sore during workout routines, back off. Slow down or do fewer repetitions.   For repetitive activities such as cycling or running, maintain your strength and nutrition.   Alternate muscle groups. For example if you are a weight lifter, work the upper body on one day and the lower body the next.   Either tight or weak muscles do not give the proper support for your knee. Tight or weak muscles do not absorb the stress placed   on the knee joint. Keep the muscles surrounding the knee strong.   Take care of mechanical problems.   If you have flat feet, orthotics or special shoes may help. See your caregiver if you need help.   Arch supports, sometimes with wedges on the inner or outer aspect of the heel, can help. These can shift pressure away from the side of the knee most bothered by osteoarthritis.   A brace called an "unloader" brace also may be used to help ease the pressure on the most arthritic side of the knee.   If your caregiver has prescribed crutches, braces,  wraps or ice, use as directed. The acronym for this is PRICE. This means protection, rest, ice, compression and elevation.   Nonsteroidal anti-inflammatory drugs (NSAID's), can help relieve pain. But if taken immediately after an injury, they may actually increase swelling. Take NSAID's with food in your stomach. Stop them if you develop stomach problems. Do not take these if you have a history of ulcers, stomach pain or bleeding from the bowel. Do not take without your caregiver's approval if you have problems with fluid retention, heart failure, or kidney problems.   For ongoing knee problems, physical therapy may be helpful.   Glucosamine and chondroitin are over-the-counter dietary supplements. Both may help relieve the pain of osteoarthritis in the knee. These medicines are different from the usual anti-inflammatory drugs. Glucosamine may decrease the rate of cartilage destruction.   Injections of a corticosteroid drug into your knee joint may help reduce the symptoms of an arthritis flare-up. They may provide pain relief that lasts a few months. You may have to wait a few months between injections. The injections do have a small increased risk of infection, water retention and elevated blood sugar levels.   Hyaluronic acid injected into damaged joints may ease pain and provide lubrication. These injections may work by reducing inflammation. A series of shots may give relief for as long as 6 months.   Topical painkillers. Applying certain ointments to your skin may help relieve the pain and stiffness of osteoarthritis. Ask your pharmacist for suggestions. Many over the-counter products are approved for temporary relief of arthritis pain.   In some countries, doctors often prescribe topical NSAID's for relief of chronic conditions such as arthritis and tendinitis. A review of treatment with NSAID creams found that they worked as well as oral medications but without the serious side effects.    PREVENTION  Maintain a healthy weight. Extra pounds put more strain on your joints.   Get strong, stay limber. Weak muscles are a common cause of knee injuries. Stretching is important. Include flexibility exercises in your workouts.   Be smart about exercise. If you have osteoarthritis, chronic knee pain or recurring injuries, you may need to change the way you exercise. This does not mean you have to stop being active. If your knees ache after jogging or playing basketball, consider switching to swimming, water aerobics or other low-impact activities, at least for a few days a week. Sometimes limiting high-impact activities will provide relief.   Make sure your shoes fit well. Choose footwear that is right for your sport.   Protect your knees. Use the proper gear for knee-sensitive activities. Use kneepads when playing volleyball or laying carpet. Buckle your seat belt every time you drive. Most shattered kneecaps occur in car accidents.   Rest when you are tired.  SEEK MEDICAL CARE IF:  You have knee pain that is continual and does not   seem to be getting better.  SEEK IMMEDIATE MEDICAL CARE IF:  Your knee joint feels hot to the touch and you have a high fever. MAKE SURE YOU:   Understand these instructions.   Will watch your condition.   Will get help right away if you are not doing well or get worse.  Document Released: 04/25/2007 Document Revised: 06/17/2011 Document Reviewed: 04/25/2007 [employer] Patient Information 2012 [employer]. 

## 2012-01-01 NOTE — ED Provider Notes (Signed)
Medical screening examination/treatment/procedure(s) were performed by non-physician practitioner and as supervising physician I was immediately available for consultation/collaboration.   Lyanne Co, MD 01/01/12 [phone]

## 2012-06-01 IMAGING — CR DG CHEST 2V
2 series · 2 of 2 positions shown · non-contrast
Comparison: 09/30/2003

CLINICAL DATA: Chest pain

CHEST - 2 VIEW

[w chest lat]
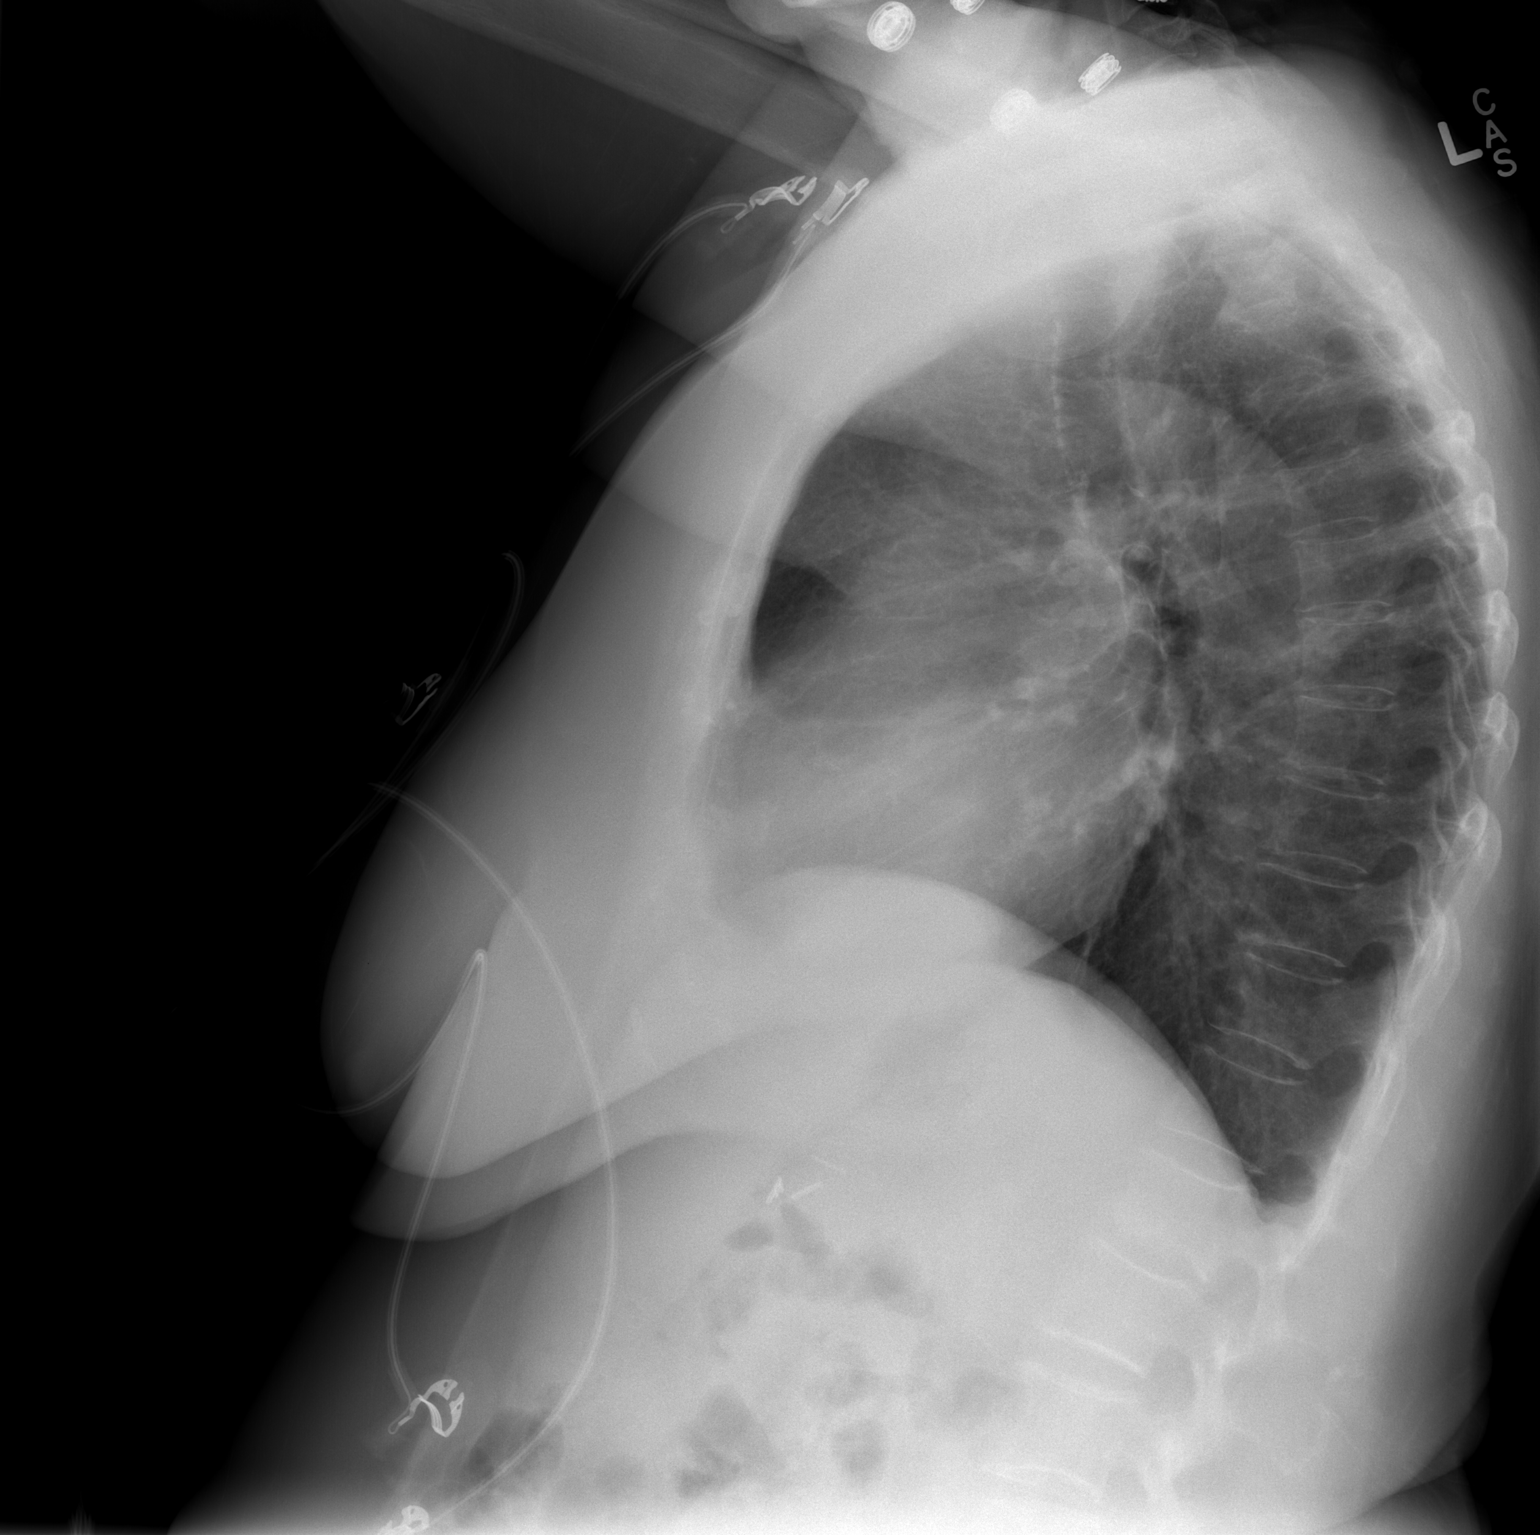

[w chest pa]
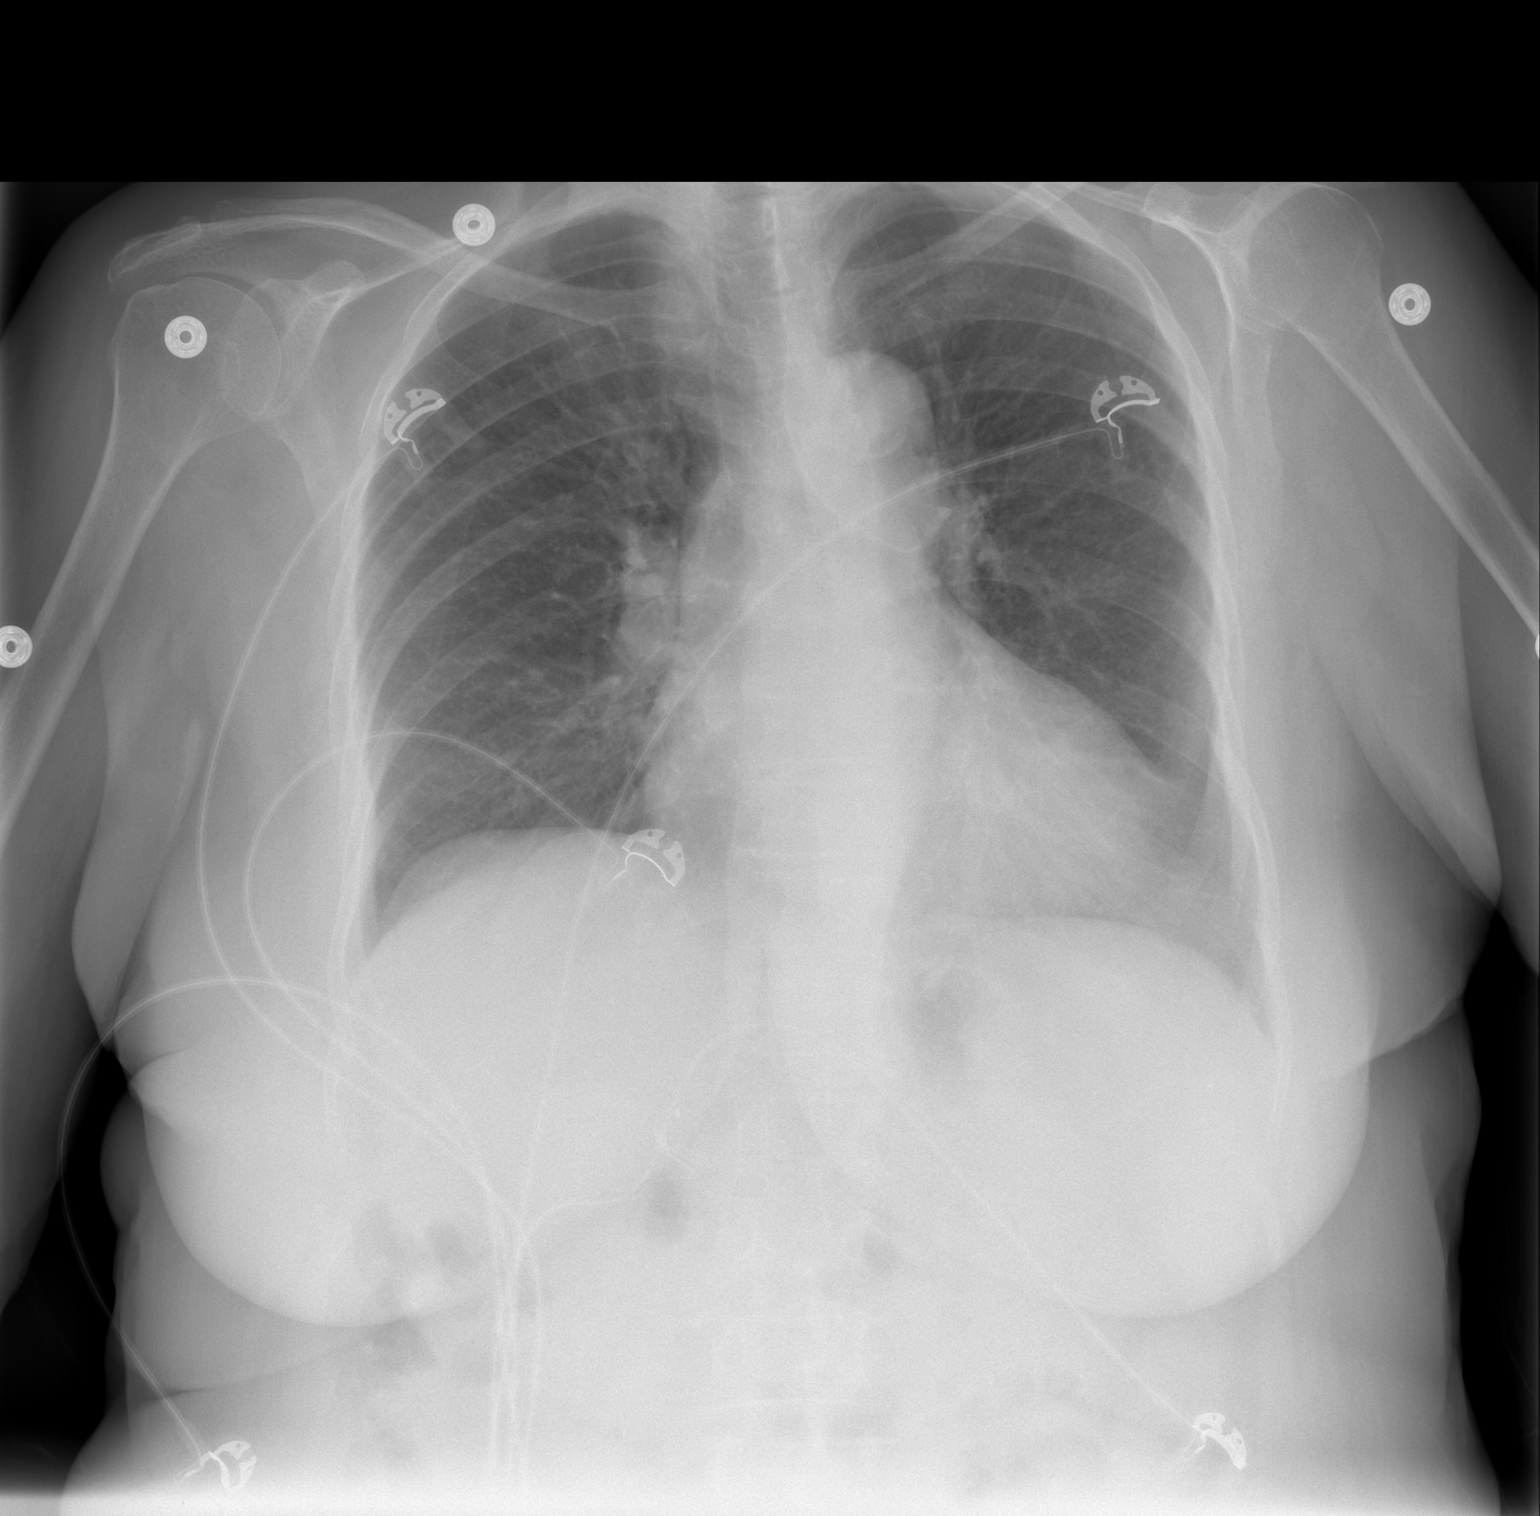

[2 of 2 positions shown; findings below may reference images not displayed]

FINDINGS: Heart size is normal.

No pleural effusion or pulmonary edema.

No airspace consolidation.
IMPRESSION: 1.  No active disease.

## 2014-06-27 ENCOUNTER — Inpatient Hospital Stay (HOSPITAL_COMMUNITY)
Admission: EM | Admit: 2014-06-27 | Discharge: 2014-06-28 | DRG: 390 | Disposition: A | Discharge status: Home / Self Care | Payer: Commercial Managed Care - HMO | Attending: Internal Medicine | Admitting: Internal Medicine

## 2014-06-27 ENCOUNTER — Encounter (HOSPITAL_COMMUNITY): Payer: Self-pay | Admitting: Emergency Medicine

## 2014-06-27 ENCOUNTER — Emergency Department (HOSPITAL_COMMUNITY): Payer: Commercial Managed Care - HMO

## 2014-06-27 DIAGNOSIS — E119 Type 2 diabetes mellitus without complications: Secondary | ICD-10-CM | POA: Diagnosis present

## 2014-06-27 DIAGNOSIS — Z90710 Acquired absence of both cervix and uterus: Secondary | ICD-10-CM

## 2014-06-27 DIAGNOSIS — K8020 Calculus of gallbladder without cholecystitis without obstruction: Secondary | ICD-10-CM | POA: Diagnosis present

## 2014-06-27 DIAGNOSIS — M1990 Unspecified osteoarthritis, unspecified site: Secondary | ICD-10-CM | POA: Diagnosis present

## 2014-06-27 DIAGNOSIS — E079 Disorder of thyroid, unspecified: Secondary | ICD-10-CM | POA: Diagnosis present

## 2014-06-27 DIAGNOSIS — K56600 Partial intestinal obstruction, unspecified as to cause: Secondary | ICD-10-CM

## 2014-06-27 DIAGNOSIS — R109 Unspecified abdominal pain: Secondary | ICD-10-CM

## 2014-06-27 DIAGNOSIS — E039 Hypothyroidism, unspecified: Secondary | ICD-10-CM | POA: Diagnosis present

## 2014-06-27 DIAGNOSIS — I10 Essential (primary) hypertension: Secondary | ICD-10-CM | POA: Diagnosis present

## 2014-06-27 DIAGNOSIS — K5669 Other intestinal obstruction: Secondary | ICD-10-CM

## 2014-06-27 DIAGNOSIS — Z87442 Personal history of urinary calculi: Secondary | ICD-10-CM

## 2014-06-27 DIAGNOSIS — D649 Anemia, unspecified: Secondary | ICD-10-CM | POA: Diagnosis present

## 2014-06-27 DIAGNOSIS — E1165 Type 2 diabetes mellitus with hyperglycemia: Secondary | ICD-10-CM

## 2014-06-27 DIAGNOSIS — K56609 Unspecified intestinal obstruction, unspecified as to partial versus complete obstruction: Secondary | ICD-10-CM | POA: Diagnosis present

## 2014-06-27 DIAGNOSIS — K838 Other specified diseases of biliary tract: Secondary | ICD-10-CM | POA: Diagnosis present

## 2014-06-27 HISTORY — DX: Calculus of kidney: N20.0

## 2014-06-27 HISTORY — DX: Calculus of gallbladder without cholecystitis without obstruction: K80.20

## 2014-06-27 HISTORY — DX: Family history of other specified conditions: Z84.89

## 2014-06-27 HISTORY — DX: Unspecified asthma, uncomplicated: J45.909

## 2014-06-27 LAB — CBC WITH DIFFERENTIAL/PLATELET
Basophils Absolute: 0 10*3/uL (ref 0.0–0.1)
Basophils Relative: 0 % (ref 0–1)
Eosinophils Absolute: 0.1 10*3/uL (ref 0.0–0.7)
Eosinophils Relative: 1 % (ref 0–5)
HCT: 34.5 % — ABNORMAL LOW (ref 36.0–46.0)
Hemoglobin: 11.3 g/dL — ABNORMAL LOW (ref 12.0–15.0)
Lymphocytes Relative: 18 % (ref 12–46)
Lymphs Abs: 2.2 10*3/uL (ref 0.7–4.0)
MCH: 28.9 pg (ref 26.0–34.0)
MCHC: 32.8 g/dL (ref 30.0–36.0)
MCV: 88.2 fL (ref 78.0–100.0)
Monocytes Absolute: 0.8 10*3/uL (ref 0.1–1.0)
Monocytes Relative: 7 % (ref 3–12)
Neutro Abs: 9 10*3/uL — ABNORMAL HIGH (ref 1.7–7.7)
Neutrophils Relative %: 74 % (ref 43–77)
Platelets: 408 10*3/uL — ABNORMAL HIGH (ref 150–400)
RBC: 3.91 MIL/uL (ref 3.87–5.11)
RDW: 14.6 % (ref 11.5–15.5)
WBC: 12.1 10*3/uL — ABNORMAL HIGH (ref 4.0–10.5)

## 2014-06-27 LAB — URINALYSIS, ROUTINE W REFLEX MICROSCOPIC
Bilirubin Urine: NEGATIVE
Glucose, UA: NEGATIVE mg/dL
Ketones, ur: NEGATIVE mg/dL
Nitrite: NEGATIVE
Protein, ur: >300 mg/dL — AB
Specific Gravity, Urine: 1.011 (ref 1.005–1.030)
Urobilinogen, UA: 0.2 mg/dL (ref 0.0–1.0)
pH: 6 (ref 5.0–8.0)

## 2014-06-27 LAB — COMPREHENSIVE METABOLIC PANEL
ALT: 11 U/L (ref 0–35)
AST: 22 U/L (ref 0–37)
Albumin: 3.9 g/dL (ref 3.5–5.2)
Alkaline Phosphatase: 80 U/L (ref 39–117)
Anion gap: 17 — ABNORMAL HIGH (ref 5–15)
BUN: 37 mg/dL — ABNORMAL HIGH (ref 6–23)
CO2: 24 mEq/L (ref 19–32)
Calcium: 9.8 mg/dL (ref 8.4–10.5)
Chloride: 100 mEq/L (ref 96–112)
Creatinine, Ser: 1.29 mg/dL — ABNORMAL HIGH (ref 0.50–1.10)
GFR calc Af Amer: 42 mL/min — ABNORMAL LOW (ref >90)
GFR calc non Af Amer: 36 mL/min — ABNORMAL LOW (ref >90)
Glucose, Bld: 174 mg/dL — ABNORMAL HIGH (ref 70–99)
Potassium: 3.8 mEq/L (ref 3.7–5.3)
Sodium: 141 mEq/L (ref 137–147)
Total Bilirubin: 0.4 mg/dL (ref 0.3–1.2)
Total Protein: 7.7 g/dL (ref 6.0–8.3)

## 2014-06-27 LAB — GLUCOSE, CAPILLARY
GLUCOSE-CAPILLARY: 101 mg/dL — AB (ref 70–99)
Glucose-Capillary: 152 mg/dL — ABNORMAL HIGH (ref 70–99)
Glucose-Capillary: 84 mg/dL (ref 70–99)

## 2014-06-27 LAB — CREATININE, SERUM
Creatinine, Ser: 1.17 mg/dL — ABNORMAL HIGH (ref 0.50–1.10)
GFR calc Af Amer: 47 mL/min — ABNORMAL LOW (ref >90)
GFR, EST NON AFRICAN AMERICAN: 40 mL/min — AB (ref >90)

## 2014-06-27 LAB — CBC
HCT: 34 % — ABNORMAL LOW (ref 36.0–46.0)
HEMOGLOBIN: 11.1 g/dL — AB (ref 12.0–15.0)
MCH: 29.1 pg (ref 26.0–34.0)
MCHC: 32.6 g/dL (ref 30.0–36.0)
MCV: 89.2 fL (ref 78.0–100.0)
Platelets: 347 10*3/uL (ref 150–400)
RBC: 3.81 MIL/uL — AB (ref 3.87–5.11)
RDW: 14.6 % (ref 11.5–15.5)
WBC: 10.4 10*3/uL (ref 4.0–10.5)

## 2014-06-27 LAB — URINE MICROSCOPIC-ADD ON

## 2014-06-27 LAB — I-STAT TROPONIN, ED: Troponin i, poc: 0.02 ng/mL (ref 0.00–0.08)

## 2014-06-27 LAB — LIPASE, BLOOD: Lipase: 40 U/L (ref 11–59)

## 2014-06-27 MED ORDER — IOHEXOL 300 MG/ML  SOLN
25.0000 mL | Freq: Once | INTRAMUSCULAR | Status: AC | PRN
  Administered 2014-06-27: 25 mL via ORAL

## 2014-06-27 MED ORDER — LORAZEPAM 2 MG/ML IJ SOLN
0.5000 mg | Freq: Once | INTRAMUSCULAR | Status: AC
  Administered 2014-06-27: 0.5 mg via INTRAVENOUS
  Filled 2014-06-27: qty 1

## 2014-06-27 MED ORDER — ONDANSETRON HCL 4 MG/2ML IJ SOLN: 4.0000 mg | Freq: Four times a day (QID) | INTRAMUSCULAR | Status: DC | PRN

## 2014-06-27 MED ORDER — KCL IN DEXTROSE-NACL 20-5-0.45 MEQ/L-%-% IV SOLN
INTRAVENOUS | Status: DC
  Administered 2014-06-27 – 2014-06-28 (×2): via INTRAVENOUS
  Filled 2014-06-27 (×5): qty 1000

## 2014-06-27 MED ORDER — ACETAMINOPHEN 650 MG RE SUPP: 650.0000 mg | Freq: Four times a day (QID) | RECTAL | Status: DC | PRN

## 2014-06-27 MED ORDER — PNEUMOCOCCAL VAC POLYVALENT 25 MCG/0.5ML IJ INJ
0.5000 mL | INJECTION | INTRAMUSCULAR | Status: DC
  Filled 2014-06-27: qty 0.5

## 2014-06-27 MED ORDER — SODIUM CHLORIDE 0.9 % IV BOLUS (SEPSIS)
1000.0000 mL | Freq: Once | INTRAVENOUS | Status: AC
  Administered 2014-06-27: 1000 mL via INTRAVENOUS

## 2014-06-27 MED ORDER — ALBUTEROL SULFATE (2.5 MG/3ML) 0.083% IN NEBU: 2.5000 mg | INHALATION_SOLUTION | RESPIRATORY_TRACT | Status: DC | PRN

## 2014-06-27 MED ORDER — ACETAMINOPHEN 325 MG PO TABS
650.0000 mg | ORAL_TABLET | Freq: Four times a day (QID) | ORAL | Status: DC | PRN
  Filled 2014-06-27: qty 2

## 2014-06-27 MED ORDER — HEPARIN SODIUM (PORCINE) 5000 UNIT/ML IJ SOLN
5000.0000 [IU] | Freq: Three times a day (TID) | INTRAMUSCULAR | Status: DC
  Administered 2014-06-27 – 2014-06-28 (×3): 5000 [IU] via SUBCUTANEOUS
  Filled 2014-06-27 (×4): qty 1

## 2014-06-27 MED ORDER — POTASSIUM CHLORIDE IN NACL 20-0.9 MEQ/L-% IV SOLN
INTRAVENOUS | Status: DC
  Administered 2014-06-27: 11:00:00 via INTRAVENOUS
  Filled 2014-06-27 (×3): qty 1000

## 2014-06-27 MED ORDER — LIDOCAINE VISCOUS 2 % MT SOLN
5.0000 mL | Freq: Once | OROMUCOSAL | Status: AC
  Administered 2014-06-27: 5 mL via OROMUCOSAL
  Filled 2014-06-27: qty 15

## 2014-06-27 MED ORDER — MORPHINE SULFATE 2 MG/ML IJ SOLN
1.0000 mg | INTRAMUSCULAR | Status: DC | PRN
  Administered 2014-06-28: 2 mg via INTRAVENOUS
  Filled 2014-06-27: qty 1

## 2014-06-27 MED ORDER — ONDANSETRON HCL 4 MG/2ML IJ SOLN
4.0000 mg | Freq: Once | INTRAMUSCULAR | Status: AC
  Administered 2014-06-27: 4 mg via INTRAVENOUS
  Filled 2014-06-27: qty 2

## 2014-06-27 MED ORDER — MORPHINE SULFATE 4 MG/ML IJ SOLN
4.0000 mg | Freq: Once | INTRAMUSCULAR | Status: AC
Start: 2014-06-27 — End: 2014-06-27
  Administered 2014-06-27: 4 mg via INTRAVENOUS
  Filled 2014-06-27: qty 1

## 2014-06-27 MED ORDER — ONDANSETRON HCL 4 MG PO TABS: 4.0000 mg | ORAL_TABLET | Freq: Four times a day (QID) | ORAL | Status: DC | PRN

## 2014-06-27 MED ORDER — ONDANSETRON HCL 4 MG/2ML IJ SOLN: 4.0000 mg | Freq: Once | INTRAMUSCULAR | Status: DC

## 2014-06-27 MED ORDER — HYDRALAZINE HCL 20 MG/ML IJ SOLN
10.0000 mg | Freq: Four times a day (QID) | INTRAMUSCULAR | Status: DC | PRN
Start: 2014-06-27 — End: 2014-06-28

## 2014-06-27 MED ORDER — LEVOTHYROXINE SODIUM 100 MCG IV SOLR
25.0000 ug | Freq: Every day | INTRAVENOUS | Status: DC
  Administered 2014-06-27 – 2014-06-28 (×2): 25 ug via INTRAVENOUS
  Filled 2014-06-27 (×3): qty 5

## 2014-06-27 MED ORDER — GUAIFENESIN-DM 100-10 MG/5ML PO SYRP: 5.0000 mL | ORAL_SOLUTION | ORAL | Status: DC | PRN

## 2014-06-27 MED ORDER — IOHEXOL 300 MG/ML  SOLN
100.0000 mL | Freq: Once | INTRAMUSCULAR | Status: AC | PRN
  Administered 2014-06-27: 80 mL via INTRAVENOUS

## 2014-06-27 NOTE — H&P (Signed)
PATIENT DETAILS Name: Janet [name] Age: 80 y.o. Sex: female Date of Birth: 16-Nov-1923 Admit Date: 06/27/2014 PCP:No primary care provider on file.   CHIEF COMPLAINT:  Abdominal Pain/Vomiting-since last night.  HPI: Janet [name] is a 80 y.o. female with a Past Medical History of hypertension, type 2 diabetes, hypothyroidism him a arthritis who presents today with the above noted complaint. Per patient, she was in her usual state of health until last evening, when she developed diffuse upper abdominal pain which gradually worsened over the night. She called her family members, who in turn called EMS and patient was brought to the emergency room. While in the emergency room she had several episodes of vomiting. A CT scan of the abdomen showed a possible small bowel obstruction, hospitalist service was asked to admit this patient for further evaluation and treatment. Patient has had a cholecystectomy, hysterectomy, and a abdominal wall hernia repair in the past. Although she has hypertension and type 2 diabetes, she is not on any medications except herbal supplements-as insurance will not pray for "brand medications", and she is apparently allergic to generic medications. The only medication she takes is levothyroxine for hypothyroidism. No history of fever, headache, chest pain, shortness of breath, dysuria or hematuria. Her last bowel movement was yesterday morning.  ALLERGIES:   Allergies  Allergen Reactions  . Other     Some generic medication    PAST MEDICAL HISTORY: Past Medical History  Diagnosis Date  . Diabetes mellitus   . Arthritis   . Hypertension   . Thyroid disease   . Anemia   . Gall stones   . Kidney stones     PAST SURGICAL HISTORY: History reviewed. No pertinent past surgical history.  MEDICATIONS AT HOME: Prior to Admission medications   Medication Sig Start Date End Date Taking? Authorizing Provider  levothyroxine (SYNTHROID, LEVOTHROID) 50  MCG tablet Take 50 mcg by mouth daily.   Yes Historical Provider, MD  OVER THE COUNTER MEDICATION Take 1 tablet by mouth daily. Diabetes Management Herbal Supplement   Yes Historical Provider, MD    FAMILY HISTORY: No family history on file.  SOCIAL HISTORY:  reports that she has never smoked. She does not have any smokeless tobacco history on file. She reports that she does not drink alcohol or use illicit drugs.  REVIEW OF SYSTEMS:  Constitutional:   No  weight loss, night sweats,  Fevers, chills, fatigue.  HEENT:    No headaches, Difficulty swallowing,Tooth/dental problems,Sore throat,   Cardio-vascular: No chest pain,  Orthopnea, PND, swelling in lower extremities, anasarca, dizziness, palpitations  GI:  No heartburn  Resp: No shortness of breath with exertion or at rest.  No excess mucus, no productive cough, No non-productive cough,  No coughing up of blood.No change in color of mucus.No wheezing.No chest wall deformity  Skin:  no rash or lesions.  GU:  no dysuria, change in color of urine, no urgency or frequency.  No flank pain.  Musculoskeletal: No joint pain or swelling.  No decreased range of motion.  No back pain.  Psych: No change in mood or affect. No depression or anxiety.  No memory loss.   PHYSICAL EXAM: Blood pressure 168/72, pulse 86, temperature 98 F (36.7 C), temperature source Oral, resp. rate 16, SpO2 88 %.  General appearance :Awake, alert-but sleepy, not in any distress. Speech Clear. Not toxic Looking. NG tube in place. HEENT: Atraumatic and Normocephalic, pupils equally reactive to light and accomodation Neck:  supple, no JVD. No cervical lymphadenopathy.  Chest:Good air entry bilaterally, no added sounds  CVS: S1 S2 regular, no murmurs.  Abdomen: Bowel sounds present but sluggish, mildly diffusely tender (status post morphine injection in the ED) and not distended with no gaurding, rigidity or rebound. Extremities: B/L Lower Ext shows no  edema, both legs are warm to touch Neurology: CN II-XII intact, Non focal Skin:No Rash Wounds:N/A  LABS ON ADMISSION:   Recent Labs  06/27/14 0420  NA 141  K 3.8  CL 100  CO2 24  GLUCOSE 174*  BUN 37*  CREATININE 1.29*  CALCIUM 9.8    Recent Labs  06/27/14 0420  AST 22  ALT 11  ALKPHOS 80  BILITOT 0.4  PROT 7.7  ALBUMIN 3.9    Recent Labs  06/27/14 0420  LIPASE 40    Recent Labs  06/27/14 0420  WBC 12.1*  NEUTROABS 9.0*  HGB 11.3*  HCT 34.5*  MCV 88.2  PLT 408*   No results for input(s): CKTOTAL, CKMB, CKMBINDEX, TROPONINI in the last 72 hours. No results for input(s): DDIMER in the last 72 hours. Invalid input(s): POCBNP   RADIOLOGIC STUDIES ON ADMISSION: Ct Abdomen Pelvis W Contrast  06/27/2014   CLINICAL DATA:  80 year old female with generalized abdominal pain, nausea and low back pain since yesterday evening. No associated fever or chills.  EXAM: CT ABDOMEN AND PELVIS WITH CONTRAST  TECHNIQUE: Multidetector CT imaging of the abdomen and pelvis was performed using the standard protocol following bolus administration of intravenous contrast.  CONTRAST:  30mL OMNIPAQUE IOHEXOL 300 MG/ML  SOLN  COMPARISON:  CT of the abdomen and pelvis 11/20/2005.  FINDINGS: Lower chest: Mild scarring in the left lower lobe. Cardiomegaly. Extensive calcifications of the mitral annulus.  Hepatobiliary: Status post cholecystectomy. There is mild intrahepatic biliary ductal dilatation. Common bile duct is also dilated measuring up to 14 mm in the porta hepatis. No cystic or solid hepatic lesions.  Pancreas: Mild dilatation of the pancreatic duct which measures up to 5 mm. Image 29 of series 2 and image 17 of series 7 demonstrate a focal area of soft tissue prominence at the interface between the pancreatic head and the duodenum, which measures approximately 12 x 10 mm. This is at or immediately adjacent to the ampulla, but is poorly characterized on today's examination. This may  simply represent a normal fold of tissue associated with the duodenum, however, the possibility of an ampullary lesion is not excluded. No other pancreatic lesion is identified at this time.  Spleen: Unremarkable.  Adrenals/Urinary Tract: Bilateral adrenal glands are normal in appearance. Multiple sub cm low-attenuation lesions in the kidneys bilaterally are too small to definitively characterize, but are statistically favored to represent small cysts. There are other larger renal lesions which are all low-attenuation compatible with simple cysts, largest of which measures 2 cm in the interpolar region of the right kidney. No hydroureteronephrosis. Urinary bladder is normal in appearance.  Stomach/Bowel: The appearance of the stomach is normal. There are multiple prominent but non dilated loops of mid small bowel involving portions of the mid the distal jejunum and proximal ileum. These measure up to only 2.7 cm in diameter about are predominantly fluid filled with several small air-fluid levels. There are 2 areas of focal narrowing of the bowel lumen, demonstrated on image 52 of series 2 in the central abdomen, and image 50 of series 2 also in the central abdomen. The first area of narrowing on image 52 is preceded by  the most pronounced bowel dilatation, and the bowel segment immediately distal to this demonstrates edematous wall thickening, best appreciated on image 55 of series 2. The subsequent bowel is also slightly prominent and fluid-filled leading up to the second area of narrowing on image 50, beyond which the bowel is much smaller caliber and nearly completely decompressed. In addition, starting on image 49 and continuing through image 33 (when viewed in reverse order) there is a completely decompressed bowel loop which traverses in an unusual fashion between the other adjacent loops all centrally located in the abdomen, beyond which the small bowel is completely decompressed. The colon has a small amount  of stool but is relatively decompressed as well. The overall appearance of the small bowel suggests a partial small bowel obstruction related to an internal hernia and/or the presence of adhesions. Notably, there is also small amount of interloop fluid within the mesentery associated with these unusual appearing bowel loops.  Vascular/Lymphatic: Atherosclerosis throughout the abdominal and pelvic vasculature, without evidence of aneurysm or dissection. No lymphadenopathy noted in the abdomen or pelvis.  Reproductive: Status post hysterectomy. Ovaries are not confidently identified may be surgically absent or atrophic.  Other: Small volume of ascites.  No pneumoperitoneum.  Musculoskeletal: There are no aggressive appearing lytic or blastic lesions noted in the visualized portions of the skeleton. 4 mm of anterolisthesis of L4 upon L5. Multilevel degenerative disc disease, most severe at L1-L2 where there are extensive discogenic changes and large Schmorl's nodes associated with the inferior endplate of L1.  IMPRESSION: 1. Unusual appearance of the mid small bowel, as detailed above. Findings are concerning for potential partial small bowel obstruction, possibly related to internal hernia and/or adhesions. The possibility of a small bowel stricture, particularly on image 52 of series 2 is also not excluded. Additionally, there is a focal segment of small bowel measuring approximately 5.7 cm immediately distal to the first area of luminal narrowing which demonstrates extensive bowel wall thickening and edema. 2. Small volume of ascites, presumably reactive. No definite signs of frank perforation of the bowel at this time. 3. Intra and extrahepatic biliary ductal dilatation. In addition, there is mild pancreatic ductal dilatation. Importantly, there is a subtle area of soft tissue prominence best demonstrated on images 29 of series 2 and 17 of series 7 at or immediately adjacent to the ampulla, suspicious for a small  neoplasm. Clinical correlation for signs and symptoms of biliary tract obstruction is recommended. If there is clinical concern for biliary obstruction, these findings could be further evaluated with followup nonemergent abdominal MRI/MRCP with and without IV gadolinium. 4. Atherosclerosis. 5. Additional incidental findings, as above. These results were called by telephone at the time of interpretation on 06/27/2014 at 7:44 am to Dr. Virgel Manifold, who verbally acknowledged these results.   Electronically Signed   By: Vinnie Langton M.D.   On: 06/27/2014 07:50     EKG: Independently reviewed. Pending  ASSESSMENT AND PLAN: Present on Admission:  . SBO (small bowel obstruction): Keep nothing by mouth, NG tube in place, IV fluids, antiemetics, as needed narcotics and other supportive care. Await general surgery evaluation. Currently abdomen is soft -with mild tenderness, however patient is status post IV morphine. Follow clinical course, daily exam.   . Hypothyroidism: Change to IV levothyroxine.   . Benign essential HTN: Not on any antihypertensive medications-apparently insurance only covers generics-from which she gets hives/ rash -claims she uses a herbal supplement. Since nothing by mouth, will monitor BP trend and use  prn IV hydralazine.   . Mild Dilation of biliary tract with possible peri-ampullary mass seen on CT scan of the abdomen: Once  better clinically, she will need a MRCP to further delineate. She is status post cholecystectomy, mild biliary dilatation could be as a result of this.   . DM-2: Not on any medications as an out patient-as she's had problems with generics causing rash/hives. Check A1c, follow CBGs, if CBGs persistently elevated will start SSI/basal insulin as needed.  Further plan will depend as patient's clinical course evolves and further radiologic and laboratory data become available. Patient will be monitored closely.  Above noted plan was discussed with  patient/daughter, they were in agreement.   DVT Prophylaxis: Prophylactic Heparin  Code Status: Full Code  Disposition Plan: Home with HHPT vs SNF (if laparotomy required) when stable  Total time spent for admission equals 45 minutes.  [facility] Pager [phone]  If 7PM-7AM, please contact night-coverage [email or web address] Password [email or web address] 06/27/2014, 9:15 AM

## 2014-06-27 NOTE — ED Notes (Signed)
Vital signs stable. 

## 2014-06-27 NOTE — Consult Note (Signed)
Janet [name] [birth year]  [id number].   Requesting MD: Dr. Virgel Manifold Chief Complaint/Reason for Consult: SBO HPI: This is an 80 yo Hispanic female who began having pretty severe abdominal pain last night around 11pm.  Her family brought her to the [facility] where she was evaluated.  Here she had 3 episodes of emesis that were bilious, but nonbloody.  She had a BM yesterday, but no flatus since then.  She's never had anything like this before.  Nothing makes it better, but pain meds.  She had a CT scan that revealed a pSBO, with some distal small bowel wall thickening.  Differentials included internal hernia, stricture, or adhesive disease.  She also has a small lesion near her ampulla or head of her pancreas with intra and extra hepatic ductal dilatation.  A small neoplasm can not be excluded.  We have been asked to see the patient for further evaluation.  ROS : Please see HPI, otherwise negative  History reviewed. No pertinent family history.  Past Medical History  Diagnosis Date  . Diabetes mellitus   . Arthritis   . Hypertension   . Thyroid disease   . Anemia   . Gall stones   . Kidney stones     Past Surgical History  Procedure Laterality Date  . Abdominal hysterectomy    . Cholecystectomy    . Hernia repair      open    Social History:  reports that she has never smoked. She does not have any smokeless tobacco history on file. She reports that she does not drink alcohol or use illicit drugs.  Allergies:  Allergies  Allergen Reactions  . Other     Some generic medication    Medications Prior to Admission  Medication Sig Dispense Refill  . levothyroxine (SYNTHROID, LEVOTHROID) 50 MCG tablet Take 50 mcg by mouth daily.    Marland Kitchen OVER THE COUNTER MEDICATION Take 1 tablet by mouth daily. Diabetes Management Herbal Supplement      Blood pressure 161/61, pulse 79, temperature 98.4 F (36.9 C), temperature source Oral, resp. rate 17, SpO2 99 %. Physical Exam: General:  elderly-appearing Hispanic female who is laying in bed in NAD HEENT: head is normocephalic, atraumatic.  Sclera are noninjected.  PERRL.  Ears and nose without any masses or lesions.  NGT in left nares with small amount of bilious output Heart: regular, rate, and rhythm.  Normal s1,s2. No obvious murmurs, gallops, or rubs noted.  Palpable radial and pedal pulses bilaterally Lungs: CTAB, no wheezes, rhonchi, or rales noted.  Respiratory effort nonlabored Abd: soft, mildly tender on right side of abdomen, mild distention, +BS, no masses, hernias, or organomegaly MS: all 4 extremities are symmetrical with no cyanosis, clubbing, or edema. Skin: warm and dry with no masses, lesions, or rashes Psych: A&Ox3 with an appropriate affect.    Results for orders placed or performed during the hospital encounter of 06/27/14 (from the past 48 hour(s))  CBC with Differential     Status: Abnormal   Collection Time: 06/27/14  4:20 AM  Result Value Ref Range   WBC 12.1 (H) 4.0 - 10.5 K/uL   RBC 3.91 3.87 - 5.11 MIL/uL   Hemoglobin 11.3 (L) 12.0 - 15.0 g/dL   HCT 34.5 (L) 36.0 - 46.0 %   MCV 88.2 78.0 - 100.0 fL   MCH 28.9 26.0 - 34.0 pg   MCHC 32.8 30.0 - 36.0 g/dL   RDW 14.6 11.5 - 15.5 %   Platelets 408 (H) 150 -  400 K/uL   Neutrophils Relative % 74 43 - 77 %   Neutro Abs 9.0 (H) 1.7 - 7.7 K/uL   Lymphocytes Relative 18 12 - 46 %   Lymphs Abs 2.2 0.7 - 4.0 K/uL   Monocytes Relative 7 3 - 12 %   Monocytes Absolute 0.8 0.1 - 1.0 K/uL   Eosinophils Relative 1 0 - 5 %   Eosinophils Absolute 0.1 0.0 - 0.7 K/uL   Basophils Relative 0 0 - 1 %   Basophils Absolute 0.0 0.0 - 0.1 K/uL  Comprehensive metabolic panel     Status: Abnormal   Collection Time: 06/27/14  4:20 AM  Result Value Ref Range   Sodium 141 137 - 147 mEq/L   Potassium 3.8 3.7 - 5.3 mEq/L   Chloride 100 96 - 112 mEq/L   CO2 24 19 - 32 mEq/L   Glucose, Bld 174 (H) 70 - 99 mg/dL   BUN 37 (H) 6 - 23 mg/dL   Creatinine, Ser 1.29 (H) 0.50  - 1.10 mg/dL   Calcium 9.8 8.4 - 10.5 mg/dL   Total Protein 7.7 6.0 - 8.3 g/dL   Albumin 3.9 3.5 - 5.2 g/dL   AST 22 0 - 37 U/L   ALT 11 0 - 35 U/L   Alkaline Phosphatase 80 39 - 117 U/L   Total Bilirubin 0.4 0.3 - 1.2 mg/dL   GFR calc non Af Amer 36 (L) >90 mL/min   GFR calc Af Amer 42 (L) >90 mL/min    Comment: (NOTE) The eGFR has been calculated using the CKD EPI equation. This calculation has not been validated in all clinical situations. eGFR's persistently <90 mL/min signify possible Chronic Kidney Disease.    Anion gap 17 (H) 5 - 15  Lipase, blood     Status: None   Collection Time: 06/27/14  4:20 AM  Result Value Ref Range   Lipase 40 11 - 59 U/L  I-stat troponin, ED (only if pt is 78 y.o. or older & pain is above umbilicus) - do not order at [facility]     Status: None   Collection Time: 06/27/14  4:25 AM  Result Value Ref Range   Troponin i, poc 0.02 0.00 - 0.08 ng/mL   Comment 3            Comment: Due to the release kinetics of cTnI, a negative result within the first hours of the onset of symptoms does not rule out myocardial infarction with certainty. If myocardial infarction is still suspected, repeat the test at appropriate intervals.   Urinalysis, Routine w reflex microscopic     Status: Abnormal   Collection Time: 06/27/14  5:25 AM  Result Value Ref Range   Color, Urine YELLOW YELLOW   APPearance CLOUDY (A) CLEAR   Specific Gravity, Urine 1.011 1.005 - 1.030   pH 6.0 5.0 - 8.0   Glucose, UA NEGATIVE NEGATIVE mg/dL   Hgb urine dipstick TRACE (A) NEGATIVE   Bilirubin Urine NEGATIVE NEGATIVE   Ketones, ur NEGATIVE NEGATIVE mg/dL   Protein, ur >300 (A) NEGATIVE mg/dL   Urobilinogen, UA 0.2 0.0 - 1.0 mg/dL   Nitrite NEGATIVE NEGATIVE   Leukocytes, UA SMALL (A) NEGATIVE  Urine microscopic-add on     Status: Abnormal   Collection Time: 06/27/14  5:25 AM  Result Value Ref Range   Squamous Epithelial / LPF RARE RARE   WBC, UA 11-20 <3 WBC/hpf   RBC / HPF 3-6 <3  RBC/hpf  Bacteria, UA RARE RARE   Casts HYALINE CASTS (A) NEGATIVE  CBC     Status: Abnormal   Collection Time: 06/27/14 11:36 AM  Result Value Ref Range   WBC 10.4 4.0 - 10.5 K/uL   RBC 3.81 (L) 3.87 - 5.11 MIL/uL   Hemoglobin 11.1 (L) 12.0 - 15.0 g/dL   HCT 34.0 (L) 36.0 - 46.0 %   MCV 89.2 78.0 - 100.0 fL   MCH 29.1 26.0 - 34.0 pg   MCHC 32.6 30.0 - 36.0 g/dL   RDW 14.6 11.5 - 15.5 %   Platelets 347 150 - 400 K/uL  Creatinine, serum     Status: Abnormal   Collection Time: 06/27/14 11:36 AM  Result Value Ref Range   Creatinine, Ser 1.17 (H) 0.50 - 1.10 mg/dL   GFR calc non Af Amer 40 (L) >90 mL/min   GFR calc Af Amer 47 (L) >90 mL/min    Comment: (NOTE) The eGFR has been calculated using the CKD EPI equation. This calculation has not been validated in all clinical situations. eGFR's persistently <90 mL/min signify possible Chronic Kidney Disease.   Glucose, capillary     Status: Abnormal   Collection Time: 06/27/14 11:52 AM  Result Value Ref Range   Glucose-Capillary 152 (H) 70 - 99 mg/dL   Ct Abdomen Pelvis W Contrast  06/27/2014   CLINICAL DATA:  80 year old female with generalized abdominal pain, nausea and low back pain since yesterday evening. No associated fever or chills.  EXAM: CT ABDOMEN AND PELVIS WITH CONTRAST  TECHNIQUE: Multidetector CT imaging of the abdomen and pelvis was performed using the standard protocol following bolus administration of intravenous contrast.  CONTRAST:  12m OMNIPAQUE IOHEXOL 300 MG/ML  SOLN  COMPARISON:  CT of the abdomen and pelvis 11/20/2005.  FINDINGS: Lower chest: Mild scarring in the left lower lobe. Cardiomegaly. Extensive calcifications of the mitral annulus.  Hepatobiliary: Status post cholecystectomy. There is mild intrahepatic biliary ductal dilatation. Common bile duct is also dilated measuring up to 14 mm in the porta hepatis. No cystic or solid hepatic lesions.  Pancreas: Mild dilatation of the pancreatic duct which measures up  to 5 mm. Image 29 of series 2 and image 17 of series 7 demonstrate a focal area of soft tissue prominence at the interface between the pancreatic head and the duodenum, which measures approximately 12 x 10 mm. This is at or immediately adjacent to the ampulla, but is poorly characterized on today's examination. This may simply represent a normal fold of tissue associated with the duodenum, however, the possibility of an ampullary lesion is not excluded. No other pancreatic lesion is identified at this time.  Spleen: Unremarkable.  Adrenals/Urinary Tract: Bilateral adrenal glands are normal in appearance. Multiple sub cm low-attenuation lesions in the kidneys bilaterally are too small to definitively characterize, but are statistically favored to represent small cysts. There are other larger renal lesions which are all low-attenuation compatible with simple cysts, largest of which measures 2 cm in the interpolar region of the right kidney. No hydroureteronephrosis. Urinary bladder is normal in appearance.  Stomach/Bowel: The appearance of the stomach is normal. There are multiple prominent but non dilated loops of mid small bowel involving portions of the mid the distal jejunum and proximal ileum. These measure up to only 2.7 cm in diameter about are predominantly fluid filled with several small air-fluid levels. There are 2 areas of focal narrowing of the bowel lumen, demonstrated on image 52 of series 2 in the central  abdomen, and image 50 of series 2 also in the central abdomen. The first area of narrowing on image 52 is preceded by the most pronounced bowel dilatation, and the bowel segment immediately distal to this demonstrates edematous wall thickening, best appreciated on image 55 of series 2. The subsequent bowel is also slightly prominent and fluid-filled leading up to the second area of narrowing on image 50, beyond which the bowel is much smaller caliber and nearly completely decompressed. In addition,  starting on image 49 and continuing through image 33 (when viewed in reverse order) there is a completely decompressed bowel loop which traverses in an unusual fashion between the other adjacent loops all centrally located in the abdomen, beyond which the small bowel is completely decompressed. The colon has a small amount of stool but is relatively decompressed as well. The overall appearance of the small bowel suggests a partial small bowel obstruction related to an internal hernia and/or the presence of adhesions. Notably, there is also small amount of interloop fluid within the mesentery associated with these unusual appearing bowel loops.  Vascular/Lymphatic: Atherosclerosis throughout the abdominal and pelvic vasculature, without evidence of aneurysm or dissection. No lymphadenopathy noted in the abdomen or pelvis.  Reproductive: Status post hysterectomy. Ovaries are not confidently identified may be surgically absent or atrophic.  Other: Small volume of ascites.  No pneumoperitoneum.  Musculoskeletal: There are no aggressive appearing lytic or blastic lesions noted in the visualized portions of the skeleton. 4 mm of anterolisthesis of L4 upon L5. Multilevel degenerative disc disease, most severe at L1-L2 where there are extensive discogenic changes and large Schmorl's nodes associated with the inferior endplate of L1.  IMPRESSION: 1. Unusual appearance of the mid small bowel, as detailed above. Findings are concerning for potential partial small bowel obstruction, possibly related to internal hernia and/or adhesions. The possibility of a small bowel stricture, particularly on image 52 of series 2 is also not excluded. Additionally, there is a focal segment of small bowel measuring approximately 5.7 cm immediately distal to the first area of luminal narrowing which demonstrates extensive bowel wall thickening and edema. 2. Small volume of ascites, presumably reactive. No definite signs of frank perforation of  the bowel at this time. 3. Intra and extrahepatic biliary ductal dilatation. In addition, there is mild pancreatic ductal dilatation. Importantly, there is a subtle area of soft tissue prominence best demonstrated on images 29 of series 2 and 17 of series 7 at or immediately adjacent to the ampulla, suspicious for a small neoplasm. Clinical correlation for signs and symptoms of biliary tract obstruction is recommended. If there is clinical concern for biliary obstruction, these findings could be further evaluated with followup nonemergent abdominal MRI/MRCP with and without IV gadolinium. 4. Atherosclerosis. 5. Additional incidental findings, as above. These results were called by telephone at the time of interpretation on 06/27/2014 at 7:44 am to Dr. Virgel Manifold, who verbally acknowledged these results.   Electronically Signed   By: Vinnie Langton M.D.   On: 06/27/2014 07:50       Assessment/Plan 1. SBO 2. Intra-extra hepatic ductal dilatation with small lesion at the head of the pancreas vs ampulla 3. DM 4. HTN  Plan: 1. We agree with conservative management at this time.  I will review the CT scan with Dr. Ninfa Linden, but she is not acute tender or ill right now suggesting any ischemia.  We will repeat abdominal films in the morning.  If the patient worsens or fails to improve with conservative  management, then she may require surgical intervention. 2. She does have a concerning lesion near her ampulla.  This will need an MRI to better evaluate to see if she needs further work up with GI and EUS, etc.  Latonya Knight E 06/27/2014, 1:15 PM Pager: [phone]

## 2014-06-27 NOTE — ED Provider Notes (Signed)
CSN: [id number]     Arrival date & time 06/27/14  0335 History   First MD Initiated Contact with Patient 06/27/14 0518     Chief Complaint  Patient presents with  . Abdominal Pain  . Emesis     (Consider location/radiation/quality/duration/timing/severity/associated sxs/prior Treatment) HPI   80yF with abdominal pain and n/v. Onset shortly before arrival. Fairly acute onset. Diffuse, but worse in upper abdomen. Speaks pretty good [place]. "It feels like my insides are squeezing." Some additional history assisted by daughter. Associated n/v. No fever or chills. No urinary complaints. No sick contacts. Surgical hx significant for cholecystectomy and hysterectomy. Has not tried anything for these symptoms.   Past Medical History  Diagnosis Date  . Diabetes mellitus   . Arthritis   . Hypertension   . Thyroid disease   . Anemia   . Gall stones   . Kidney stones    History reviewed. No pertinent past surgical history. No family history on file. History  Substance Use Topics  . Smoking status: Never Smoker   . Smokeless tobacco: Not on file  . Alcohol Use: No   OB History    No data available     Review of Systems  All systems reviewed and negative, other than as noted in HPI.   Allergies  Other  Home Medications   Prior to Admission medications   Medication Sig Start Date End Date Taking? Authorizing Provider  levothyroxine (SYNTHROID, LEVOTHROID) 50 MCG tablet Take 50 mcg by mouth daily.   Yes Historical Provider, MD  OVER THE COUNTER MEDICATION Take 1 tablet by mouth daily. Diabetes Management Herbal Supplement   Yes Historical Provider, MD   BP 164/58 mmHg  Pulse 75  Temp(Src) 98 F (36.7 C) (Oral)  Resp 16  SpO2 100% Physical Exam  Constitutional: She appears well-developed and well-nourished. No distress.  HENT:  Head: Normocephalic and atraumatic.  Eyes: Conjunctivae are normal. Right eye exhibits no discharge. Left eye exhibits no discharge.  Neck:  Neck supple.  Cardiovascular: Normal rate, regular rhythm and normal heart sounds.  Exam reveals no gallop and no friction rub.   No murmur heard. Pulmonary/Chest: Effort normal and breath sounds normal. No respiratory distress.  Abdominal: Soft. She exhibits no distension. There is tenderness.  Diffuse abdominal tenderness. No guarding or rebound. No distension.   Musculoskeletal: She exhibits no edema or tenderness.  Neurological: She is alert.  Skin: Skin is warm and dry.  Psychiatric: She has a normal mood and affect. Her behavior is normal. Thought content normal.  Nursing note and vitals reviewed.   ED Course  Procedures (including critical care time) Labs Review Labs Reviewed  CBC WITH DIFFERENTIAL - Abnormal; Notable for the following:    WBC 12.1 (*)    Hemoglobin 11.3 (*)    HCT 34.5 (*)    Platelets 408 (*)    Neutro Abs 9.0 (*)    All other components within normal limits  COMPREHENSIVE METABOLIC PANEL - Abnormal; Notable for the following:    Glucose, Bld 174 (*)    BUN 37 (*)    Creatinine, Ser 1.29 (*)    GFR calc non Af Amer 36 (*)    GFR calc Af Amer 42 (*)    Anion gap 17 (*)    All other components within normal limits  URINALYSIS, ROUTINE W REFLEX MICROSCOPIC - Abnormal; Notable for the following:    APPearance CLOUDY (*)    Hgb urine dipstick TRACE (*)  Protein, ur >300 (*)    Leukocytes, UA SMALL (*)    All other components within normal limits  URINE MICROSCOPIC-ADD ON - Abnormal; Notable for the following:    Casts HYALINE CASTS (*)    All other components within normal limits  LIPASE, BLOOD  I-STAT TROPOININ, ED    Imaging Review Ct Abdomen Pelvis W Contrast  06/27/2014   CLINICAL DATA:  80 year old female with generalized abdominal pain, nausea and low back pain since yesterday evening. No associated fever or chills.  EXAM: CT ABDOMEN AND PELVIS WITH CONTRAST  TECHNIQUE: Multidetector CT imaging of the abdomen and pelvis was performed using  the standard protocol following bolus administration of intravenous contrast.  CONTRAST:  78mL OMNIPAQUE IOHEXOL 300 MG/ML  SOLN  COMPARISON:  CT of the abdomen and pelvis 11/20/2005.  FINDINGS: Lower chest: Mild scarring in the left lower lobe. Cardiomegaly. Extensive calcifications of the mitral annulus.  Hepatobiliary: Status post cholecystectomy. There is mild intrahepatic biliary ductal dilatation. Common bile duct is also dilated measuring up to 14 mm in the porta hepatis. No cystic or solid hepatic lesions.  Pancreas: Mild dilatation of the pancreatic duct which measures up to 5 mm. Image 29 of series 2 and image 17 of series 7 demonstrate a focal area of soft tissue prominence at the interface between the pancreatic head and the duodenum, which measures approximately 12 x 10 mm. This is at or immediately adjacent to the ampulla, but is poorly characterized on today's examination. This may simply represent a normal fold of tissue associated with the duodenum, however, the possibility of an ampullary lesion is not excluded. No other pancreatic lesion is identified at this time.  Spleen: Unremarkable.  Adrenals/Urinary Tract: Bilateral adrenal glands are normal in appearance. Multiple sub cm low-attenuation lesions in the kidneys bilaterally are too small to definitively characterize, but are statistically favored to represent small cysts. There are other larger renal lesions which are all low-attenuation compatible with simple cysts, largest of which measures 2 cm in the interpolar region of the right kidney. No hydroureteronephrosis. Urinary bladder is normal in appearance.  Stomach/Bowel: The appearance of the stomach is normal. There are multiple prominent but non dilated loops of mid small bowel involving portions of the mid the distal jejunum and proximal ileum. These measure up to only 2.7 cm in diameter about are predominantly fluid filled with several small air-fluid levels. There are 2 areas of focal  narrowing of the bowel lumen, demonstrated on image 52 of series 2 in the central abdomen, and image 50 of series 2 also in the central abdomen. The first area of narrowing on image 52 is preceded by the most pronounced bowel dilatation, and the bowel segment immediately distal to this demonstrates edematous wall thickening, best appreciated on image 55 of series 2. The subsequent bowel is also slightly prominent and fluid-filled leading up to the second area of narrowing on image 50, beyond which the bowel is much smaller caliber and nearly completely decompressed. In addition, starting on image 49 and continuing through image 33 (when viewed in reverse order) there is a completely decompressed bowel loop which traverses in an unusual fashion between the other adjacent loops all centrally located in the abdomen, beyond which the small bowel is completely decompressed. The colon has a small amount of stool but is relatively decompressed as well. The overall appearance of the small bowel suggests a partial small bowel obstruction related to an internal hernia and/or the presence of adhesions. Notably,  there is also small amount of interloop fluid within the mesentery associated with these unusual appearing bowel loops.  Vascular/Lymphatic: Atherosclerosis throughout the abdominal and pelvic vasculature, without evidence of aneurysm or dissection. No lymphadenopathy noted in the abdomen or pelvis.  Reproductive: Status post hysterectomy. Ovaries are not confidently identified may be surgically absent or atrophic.  Other: Small volume of ascites.  No pneumoperitoneum.  Musculoskeletal: There are no aggressive appearing lytic or blastic lesions noted in the visualized portions of the skeleton. 4 mm of anterolisthesis of L4 upon L5. Multilevel degenerative disc disease, most severe at L1-L2 where there are extensive discogenic changes and large Schmorl's nodes associated with the inferior endplate of L1.  IMPRESSION: 1.  Unusual appearance of the mid small bowel, as detailed above. Findings are concerning for potential partial small bowel obstruction, possibly related to internal hernia and/or adhesions. The possibility of a small bowel stricture, particularly on image 52 of series 2 is also not excluded. Additionally, there is a focal segment of small bowel measuring approximately 5.7 cm immediately distal to the first area of luminal narrowing which demonstrates extensive bowel wall thickening and edema. 2. Small volume of ascites, presumably reactive. No definite signs of frank perforation of the bowel at this time. 3. Intra and extrahepatic biliary ductal dilatation. In addition, there is mild pancreatic ductal dilatation. Importantly, there is a subtle area of soft tissue prominence best demonstrated on images 29 of series 2 and 17 of series 7 at or immediately adjacent to the ampulla, suspicious for a small neoplasm. Clinical correlation for signs and symptoms of biliary tract obstruction is recommended. If there is clinical concern for biliary obstruction, these findings could be further evaluated with followup nonemergent abdominal MRI/MRCP with and without IV gadolinium. 4. Atherosclerosis. 5. Additional incidental findings, as above. These results were called by telephone at the time of interpretation on 06/27/2014 at 7:44 am to Dr. Virgel Manifold, who verbally acknowledged these results.   Electronically Signed   By: Vinnie Langton M.D.   On: 06/27/2014 07:50     EKG Interpretation None      MDM   Final diagnoses:  Abdominal pain  Partial small bowel obstruction    80yF with abdominal pain and n/v. CT as above. NGT. Discussed with surgery. Will see in consultation. Will discuss with medicine.     Virgel Manifold, MD 06/27/14 [phone]

## 2014-06-27 NOTE — ED Notes (Signed)
Pt. reports generalized abdominal pain with nausea and low back pain onset last night , denies fever or chills.

## 2014-06-27 NOTE — Progress Notes (Signed)
PT Cancellation Note  Patient Details Name: Janet [name] MRN: [id number] DOB: [birth year]   Cancelled Treatment:    Reason Eval/Treat Not Completed: Patient declined, no reason specified Pt sleeping upon PT arrival. Family in room politely declined therapy services reporting pt has not had much sleep in the last 24 hours. Will follow up next available time.   Candy Sledge A 06/27/2014, 4:20 PM Candy Sledge, [place], DPT [phone]

## 2014-06-28 ENCOUNTER — Inpatient Hospital Stay (HOSPITAL_COMMUNITY): Payer: Commercial Managed Care - HMO

## 2014-06-28 DIAGNOSIS — I369 Nonrheumatic tricuspid valve disorder, unspecified: Secondary | ICD-10-CM

## 2014-06-28 LAB — URINE CULTURE: Colony Count: 30000

## 2014-06-28 LAB — GLUCOSE, CAPILLARY
GLUCOSE-CAPILLARY: 135 mg/dL — AB (ref 70–99)
Glucose-Capillary: 178 mg/dL — ABNORMAL HIGH (ref 70–99)
Glucose-Capillary: 181 mg/dL — ABNORMAL HIGH (ref 70–99)

## 2014-06-28 LAB — CBC
HCT: 31 % — ABNORMAL LOW (ref 36.0–46.0)
Hemoglobin: 10 g/dL — ABNORMAL LOW (ref 12.0–15.0)
MCH: 29.3 pg (ref 26.0–34.0)
MCHC: 32.3 g/dL (ref 30.0–36.0)
MCV: 90.9 fL (ref 78.0–100.0)
PLATELETS: 340 10*3/uL (ref 150–400)
RBC: 3.41 MIL/uL — AB (ref 3.87–5.11)
RDW: 15 % (ref 11.5–15.5)
WBC: 8.2 10*3/uL (ref 4.0–10.5)

## 2014-06-28 LAB — BASIC METABOLIC PANEL
Anion gap: 11 (ref 5–15)
BUN: 25 mg/dL — ABNORMAL HIGH (ref 6–23)
CO2: 23 mEq/L (ref 19–32)
Calcium: 8.8 mg/dL (ref 8.4–10.5)
Chloride: 108 mEq/L (ref 96–112)
Creatinine, Ser: 1.36 mg/dL — ABNORMAL HIGH (ref 0.50–1.10)
GFR, EST AFRICAN AMERICAN: 39 mL/min — AB (ref >90)
GFR, EST NON AFRICAN AMERICAN: 34 mL/min — AB (ref >90)
Glucose, Bld: 179 mg/dL — ABNORMAL HIGH (ref 70–99)
POTASSIUM: 4.2 meq/L (ref 3.7–5.3)
SODIUM: 142 meq/L (ref 137–147)

## 2014-06-28 LAB — HEMOGLOBIN A1C
HEMOGLOBIN A1C: 6.8 % — AB (ref <5.7)
MEAN PLASMA GLUCOSE: 148 mg/dL — AB (ref <117)

## 2014-06-28 MED ORDER — MORPHINE SULFATE 2 MG/ML IJ SOLN
1.0000 mg | INTRAMUSCULAR | Status: DC | PRN
  Filled 2014-06-28: qty 1

## 2014-06-28 MED ORDER — SODIUM CHLORIDE 0.9 % IV BOLUS (SEPSIS)
500.0000 mL | INTRAVENOUS | Status: DC | PRN
Start: 2014-06-28 — End: 2014-06-28

## 2014-06-28 MED ORDER — SODIUM CHLORIDE 0.9 % IV BOLUS (SEPSIS): 1000.0000 mL | INTRAVENOUS | Status: DC | PRN

## 2014-06-28 NOTE — Progress Notes (Signed)
*  PRELIMINARY RESULTS* Echocardiogram 2D Echocardiogram has been performed.  Leavy Cella 06/28/2014, 10:26 AM

## 2014-06-28 NOTE — Progress Notes (Signed)
Patient ID: Janet [name], female   DOB: 08-23-1923, 80 y.o.   MRN: [id number]    Subjective: Pt is upset about her NGT.  It is making her ears and throat hurt.  She has not passed flatus.  Objective: Vital signs in last 24 hours: Temp:  [98.1 F (36.7 C)-98.4 F (36.9 C)] 98.4 F (36.9 C) (12/18 0609) Pulse Rate:  [70-73] 70 (12/18 0609) Resp:  [18-19] 18 (12/18 0609) BP: (157-180)/(50-62) 161/62 mmHg (12/18 0609) SpO2:  [96 %-100 %] 99 % (12/18 0609) Last BM Date: 06/26/14  Intake/Output from previous day: 12/17 0701 - 12/18 0700 In: 800 [I.V.:800] Out: 1450 [Urine:650; Emesis/NG output:800] Intake/Output this shift:    PE: Abd: soft, few BS, ND, minimally tender, NGT clamped right now  Lab Results:   Recent Labs  06/27/14 1136 06/28/14 0434  WBC 10.4 8.2  HGB 11.1* 10.0*  HCT 34.0* 31.0*  PLT 347 340   BMET  Recent Labs  06/27/14 0420 06/27/14 1136 06/28/14 0434  NA 141  --  142  K 3.8  --  4.2  CL 100  --  108  CO2 24  --  23  GLUCOSE 174*  --  179*  BUN 37*  --  25*  CREATININE 1.29* 1.17* 1.36*  CALCIUM 9.8  --  8.8   PT/INR No results for input(s): LABPROT, INR in the last 72 hours. CMP     Component Value Date/Time   NA 142 06/28/2014 0434   K 4.2 06/28/2014 0434   CL 108 06/28/2014 0434   CO2 23 06/28/2014 0434   GLUCOSE 179* 06/28/2014 0434   BUN 25* 06/28/2014 0434   CREATININE 1.36* 06/28/2014 0434   CALCIUM 8.8 06/28/2014 0434   PROT 7.7 06/27/2014 0420   ALBUMIN 3.9 06/27/2014 0420   AST 22 06/27/2014 0420   ALT 11 06/27/2014 0420   ALKPHOS 80 06/27/2014 0420   BILITOT 0.4 06/27/2014 0420   GFRNONAA 34* 06/28/2014 0434   GFRAA 39* 06/28/2014 0434   Lipase     Component Value Date/Time   LIPASE 40 06/27/2014 0420       Studies/Results: Ct Abdomen Pelvis W Contrast  06/27/2014   CLINICAL DATA:  80 year old female with generalized abdominal pain, nausea and low back pain since yesterday evening. No associated fever or  chills.  EXAM: CT ABDOMEN AND PELVIS WITH CONTRAST  TECHNIQUE: Multidetector CT imaging of the abdomen and pelvis was performed using the standard protocol following bolus administration of intravenous contrast.  CONTRAST:  24mL OMNIPAQUE IOHEXOL 300 MG/ML  SOLN  COMPARISON:  CT of the abdomen and pelvis 11/20/2005.  FINDINGS: Lower chest: Mild scarring in the left lower lobe. Cardiomegaly. Extensive calcifications of the mitral annulus.  Hepatobiliary: Status post cholecystectomy. There is mild intrahepatic biliary ductal dilatation. Common bile duct is also dilated measuring up to 14 mm in the porta hepatis. No cystic or solid hepatic lesions.  Pancreas: Mild dilatation of the pancreatic duct which measures up to 5 mm. Image 29 of series 2 and image 17 of series 7 demonstrate a focal area of soft tissue prominence at the interface between the pancreatic head and the duodenum, which measures approximately 12 x 10 mm. This is at or immediately adjacent to the ampulla, but is poorly characterized on today's examination. This may simply represent a normal fold of tissue associated with the duodenum, however, the possibility of an ampullary lesion is not excluded. No other pancreatic lesion is identified at this time.  Spleen:  Unremarkable.  Adrenals/Urinary Tract: Bilateral adrenal glands are normal in appearance. Multiple sub cm low-attenuation lesions in the kidneys bilaterally are too small to definitively characterize, but are statistically favored to represent small cysts. There are other larger renal lesions which are all low-attenuation compatible with simple cysts, largest of which measures 2 cm in the interpolar region of the right kidney. No hydroureteronephrosis. Urinary bladder is normal in appearance.  Stomach/Bowel: The appearance of the stomach is normal. There are multiple prominent but non dilated loops of mid small bowel involving portions of the mid the distal jejunum and proximal ileum. These  measure up to only 2.7 cm in diameter about are predominantly fluid filled with several small air-fluid levels. There are 2 areas of focal narrowing of the bowel lumen, demonstrated on image 52 of series 2 in the central abdomen, and image 50 of series 2 also in the central abdomen. The first area of narrowing on image 52 is preceded by the most pronounced bowel dilatation, and the bowel segment immediately distal to this demonstrates edematous wall thickening, best appreciated on image 55 of series 2. The subsequent bowel is also slightly prominent and fluid-filled leading up to the second area of narrowing on image 50, beyond which the bowel is much smaller caliber and nearly completely decompressed. In addition, starting on image 49 and continuing through image 33 (when viewed in reverse order) there is a completely decompressed bowel loop which traverses in an unusual fashion between the other adjacent loops all centrally located in the abdomen, beyond which the small bowel is completely decompressed. The colon has a small amount of stool but is relatively decompressed as well. The overall appearance of the small bowel suggests a partial small bowel obstruction related to an internal hernia and/or the presence of adhesions. Notably, there is also small amount of interloop fluid within the mesentery associated with these unusual appearing bowel loops.  Vascular/Lymphatic: Atherosclerosis throughout the abdominal and pelvic vasculature, without evidence of aneurysm or dissection. No lymphadenopathy noted in the abdomen or pelvis.  Reproductive: Status post hysterectomy. Ovaries are not confidently identified may be surgically absent or atrophic.  Other: Small volume of ascites.  No pneumoperitoneum.  Musculoskeletal: There are no aggressive appearing lytic or blastic lesions noted in the visualized portions of the skeleton. 4 mm of anterolisthesis of L4 upon L5. Multilevel degenerative disc disease, most severe at  L1-L2 where there are extensive discogenic changes and large Schmorl's nodes associated with the inferior endplate of L1.  IMPRESSION: 1. Unusual appearance of the mid small bowel, as detailed above. Findings are concerning for potential partial small bowel obstruction, possibly related to internal hernia and/or adhesions. The possibility of a small bowel stricture, particularly on image 52 of series 2 is also not excluded. Additionally, there is a focal segment of small bowel measuring approximately 5.7 cm immediately distal to the first area of luminal narrowing which demonstrates extensive bowel wall thickening and edema. 2. Small volume of ascites, presumably reactive. No definite signs of frank perforation of the bowel at this time. 3. Intra and extrahepatic biliary ductal dilatation. In addition, there is mild pancreatic ductal dilatation. Importantly, there is a subtle area of soft tissue prominence best demonstrated on images 29 of series 2 and 17 of series 7 at or immediately adjacent to the ampulla, suspicious for a small neoplasm. Clinical correlation for signs and symptoms of biliary tract obstruction is recommended. If there is clinical concern for biliary obstruction, these findings could be further evaluated  with followup nonemergent abdominal MRI/MRCP with and without IV gadolinium. 4. Atherosclerosis. 5. Additional incidental findings, as above. These results were called by telephone at the time of interpretation on 06/27/2014 at 7:44 am to Dr. Virgel Manifold, who verbally acknowledged these results.   Electronically Signed   By: Vinnie Langton M.D.   On: 06/27/2014 07:50   Dg Abd 2 Views  06/28/2014   CLINICAL DATA:  Abdominal pain. Small-bowel obstruction. Nasogastric tube placement.  EXAM: ABDOMEN - 2 VIEW  COMPARISON:  Abdominal pelvic CT 06/27/2014.  FINDINGS: Nasogastric tube has been placed, projecting just below the gastroesophageal junction. There are persistent air-fluid levels  throughout the small bowel. There is no evidence of pneumatosis or free intraperitoneal air. Cholecystectomy clips and mild lumbar spondylosis noted. contrast is present within the bladder from the preceding CT.  IMPRESSION: Nasogastric tube tip is in the proximal stomach and should be advanced. No significant change in small bowel obstruction pattern.   Electronically Signed   By: Camie Patience M.D.   On: 06/28/2014 08:10    Anti-infectives: Anti-infectives    None       Assessment/Plan  1. SBO 2. Possible ampullary or pancreatic lesion  Plan: 1. I spent greater than 45 minutes in the room with the patient and her family explaining why her NGT is neccessary and why it appears on her x-rays that her bowel obstruction has not resolved yet.  I went in to great detail about this process.  Her granddaughter translated for her so that she would understand better.  She seemed to understand, but still wished to have her NGT removed and to be sent home.  I then explained she would have to leave AMA because I did not think that her primary team would agree to discharge her either given her problem had not resolved.  This is what she wanted.  I explained to her the risks of going home without this resolved, such as bowel perforation or aspiration PNA possibly leading to death.  She understands and still wants to go home.  I have called Dr. Wynelle Cleveland and informed her of all of this as well.  LOS: 1 day    Jaelene Garciagarcia E 06/28/2014, 11:35 AM Pager: [phone]

## 2014-06-28 NOTE — Progress Notes (Signed)
I was made aware that patient will go home against medical advice.  Patient's NG tube and IV was removed after making sure that the patient wanted to proceed with AMA. Patient signed AMA with family members at bedside.

## 2014-06-28 NOTE — Plan of Care (Signed)
Problem: Phase I Progression Outcomes Goal: Other Phase I Outcomes/Goals Problem unresolved as patient will discharge against medical advise.

## 2014-06-28 NOTE — Progress Notes (Signed)
PT Cancellation Note  Patient Details Name: Janet [name] MRN: [id number] DOB: [birth year]   Cancelled Treatment:    Reason Eval/Treat Not Completed: Patient declined, no reason specified;Other (comment) (Pt has a fall history per family).  Encouraged them to follow up PT needs with PCP.   Ramond Dial 06/28/2014, 1:25 PM  Mee Hives, PT [facility] Acute Rehab Dept. Number: [phone]

## 2014-07-15 DIAGNOSIS — N183 Chronic kidney disease, stage 3 (moderate): Secondary | ICD-10-CM | POA: Diagnosis not present

## 2014-07-15 DIAGNOSIS — I129 Hypertensive chronic kidney disease with stage 1 through stage 4 chronic kidney disease, or unspecified chronic kidney disease: Secondary | ICD-10-CM | POA: Diagnosis not present

## 2014-07-15 DIAGNOSIS — E039 Hypothyroidism, unspecified: Secondary | ICD-10-CM | POA: Diagnosis not present

## 2014-07-15 DIAGNOSIS — E119 Type 2 diabetes mellitus without complications: Secondary | ICD-10-CM | POA: Diagnosis not present

## 2014-07-18 DIAGNOSIS — I129 Hypertensive chronic kidney disease with stage 1 through stage 4 chronic kidney disease, or unspecified chronic kidney disease: Secondary | ICD-10-CM | POA: Diagnosis not present

## 2014-07-18 DIAGNOSIS — E119 Type 2 diabetes mellitus without complications: Secondary | ICD-10-CM | POA: Diagnosis not present

## 2014-07-18 DIAGNOSIS — E039 Hypothyroidism, unspecified: Secondary | ICD-10-CM | POA: Diagnosis not present

## 2014-07-18 DIAGNOSIS — N183 Chronic kidney disease, stage 3 (moderate): Secondary | ICD-10-CM | POA: Diagnosis not present

## 2014-07-19 DIAGNOSIS — N183 Chronic kidney disease, stage 3 (moderate): Secondary | ICD-10-CM | POA: Diagnosis not present

## 2014-07-19 DIAGNOSIS — E039 Hypothyroidism, unspecified: Secondary | ICD-10-CM | POA: Diagnosis not present

## 2014-07-19 DIAGNOSIS — E119 Type 2 diabetes mellitus without complications: Secondary | ICD-10-CM | POA: Diagnosis not present

## 2014-07-19 DIAGNOSIS — I129 Hypertensive chronic kidney disease with stage 1 through stage 4 chronic kidney disease, or unspecified chronic kidney disease: Secondary | ICD-10-CM | POA: Diagnosis not present

## 2014-07-24 DIAGNOSIS — E039 Hypothyroidism, unspecified: Secondary | ICD-10-CM | POA: Diagnosis not present

## 2014-07-24 DIAGNOSIS — N183 Chronic kidney disease, stage 3 (moderate): Secondary | ICD-10-CM | POA: Diagnosis not present

## 2014-07-24 DIAGNOSIS — I129 Hypertensive chronic kidney disease with stage 1 through stage 4 chronic kidney disease, or unspecified chronic kidney disease: Secondary | ICD-10-CM | POA: Diagnosis not present

## 2014-07-24 DIAGNOSIS — E119 Type 2 diabetes mellitus without complications: Secondary | ICD-10-CM | POA: Diagnosis not present

## 2014-07-31 DIAGNOSIS — N183 Chronic kidney disease, stage 3 (moderate): Secondary | ICD-10-CM | POA: Diagnosis not present

## 2014-07-31 DIAGNOSIS — I129 Hypertensive chronic kidney disease with stage 1 through stage 4 chronic kidney disease, or unspecified chronic kidney disease: Secondary | ICD-10-CM | POA: Diagnosis not present

## 2014-07-31 DIAGNOSIS — E119 Type 2 diabetes mellitus without complications: Secondary | ICD-10-CM | POA: Diagnosis not present

## 2014-07-31 DIAGNOSIS — E039 Hypothyroidism, unspecified: Secondary | ICD-10-CM | POA: Diagnosis not present

## 2014-08-28 DIAGNOSIS — N183 Chronic kidney disease, stage 3 (moderate): Secondary | ICD-10-CM | POA: Diagnosis not present

## 2014-08-28 DIAGNOSIS — I129 Hypertensive chronic kidney disease with stage 1 through stage 4 chronic kidney disease, or unspecified chronic kidney disease: Secondary | ICD-10-CM | POA: Diagnosis not present

## 2014-08-28 DIAGNOSIS — E039 Hypothyroidism, unspecified: Secondary | ICD-10-CM | POA: Diagnosis not present

## 2014-08-28 DIAGNOSIS — E119 Type 2 diabetes mellitus without complications: Secondary | ICD-10-CM | POA: Diagnosis not present

## 2015-01-01 DIAGNOSIS — E119 Type 2 diabetes mellitus without complications: Secondary | ICD-10-CM | POA: Diagnosis not present

## 2015-01-01 DIAGNOSIS — R5382 Chronic fatigue, unspecified: Secondary | ICD-10-CM | POA: Diagnosis not present

## 2015-01-01 DIAGNOSIS — I10 Essential (primary) hypertension: Secondary | ICD-10-CM | POA: Diagnosis not present

## 2015-01-01 DIAGNOSIS — R5383 Other fatigue: Secondary | ICD-10-CM | POA: Diagnosis not present

## 2015-01-01 DIAGNOSIS — E039 Hypothyroidism, unspecified: Secondary | ICD-10-CM | POA: Diagnosis not present

## 2015-01-01 DIAGNOSIS — M1990 Unspecified osteoarthritis, unspecified site: Secondary | ICD-10-CM | POA: Diagnosis not present

## 2015-01-01 DIAGNOSIS — E079 Disorder of thyroid, unspecified: Secondary | ICD-10-CM | POA: Diagnosis not present

## 2015-08-29 DIAGNOSIS — K219 Gastro-esophageal reflux disease without esophagitis: Secondary | ICD-10-CM | POA: Diagnosis not present

## 2015-08-29 DIAGNOSIS — I10 Essential (primary) hypertension: Secondary | ICD-10-CM | POA: Diagnosis not present

## 2015-08-29 DIAGNOSIS — R5383 Other fatigue: Secondary | ICD-10-CM | POA: Diagnosis not present

## 2015-08-29 DIAGNOSIS — R5382 Chronic fatigue, unspecified: Secondary | ICD-10-CM | POA: Diagnosis not present

## 2015-08-29 DIAGNOSIS — E119 Type 2 diabetes mellitus without complications: Secondary | ICD-10-CM | POA: Diagnosis not present

## 2015-08-29 DIAGNOSIS — M1990 Unspecified osteoarthritis, unspecified site: Secondary | ICD-10-CM | POA: Diagnosis not present

## 2015-11-30 IMAGING — CR DG ABDOMEN 2V
2 series · 2 of 2 positions shown · non-contrast
Comparison: Abdominal pelvic CT 06/27/2014.

CLINICAL DATA: Abdominal pain. Small-bowel obstruction. Nasogastric
tube placement.

EXAM:
ABDOMEN - 2 VIEW

[abdomen erect]
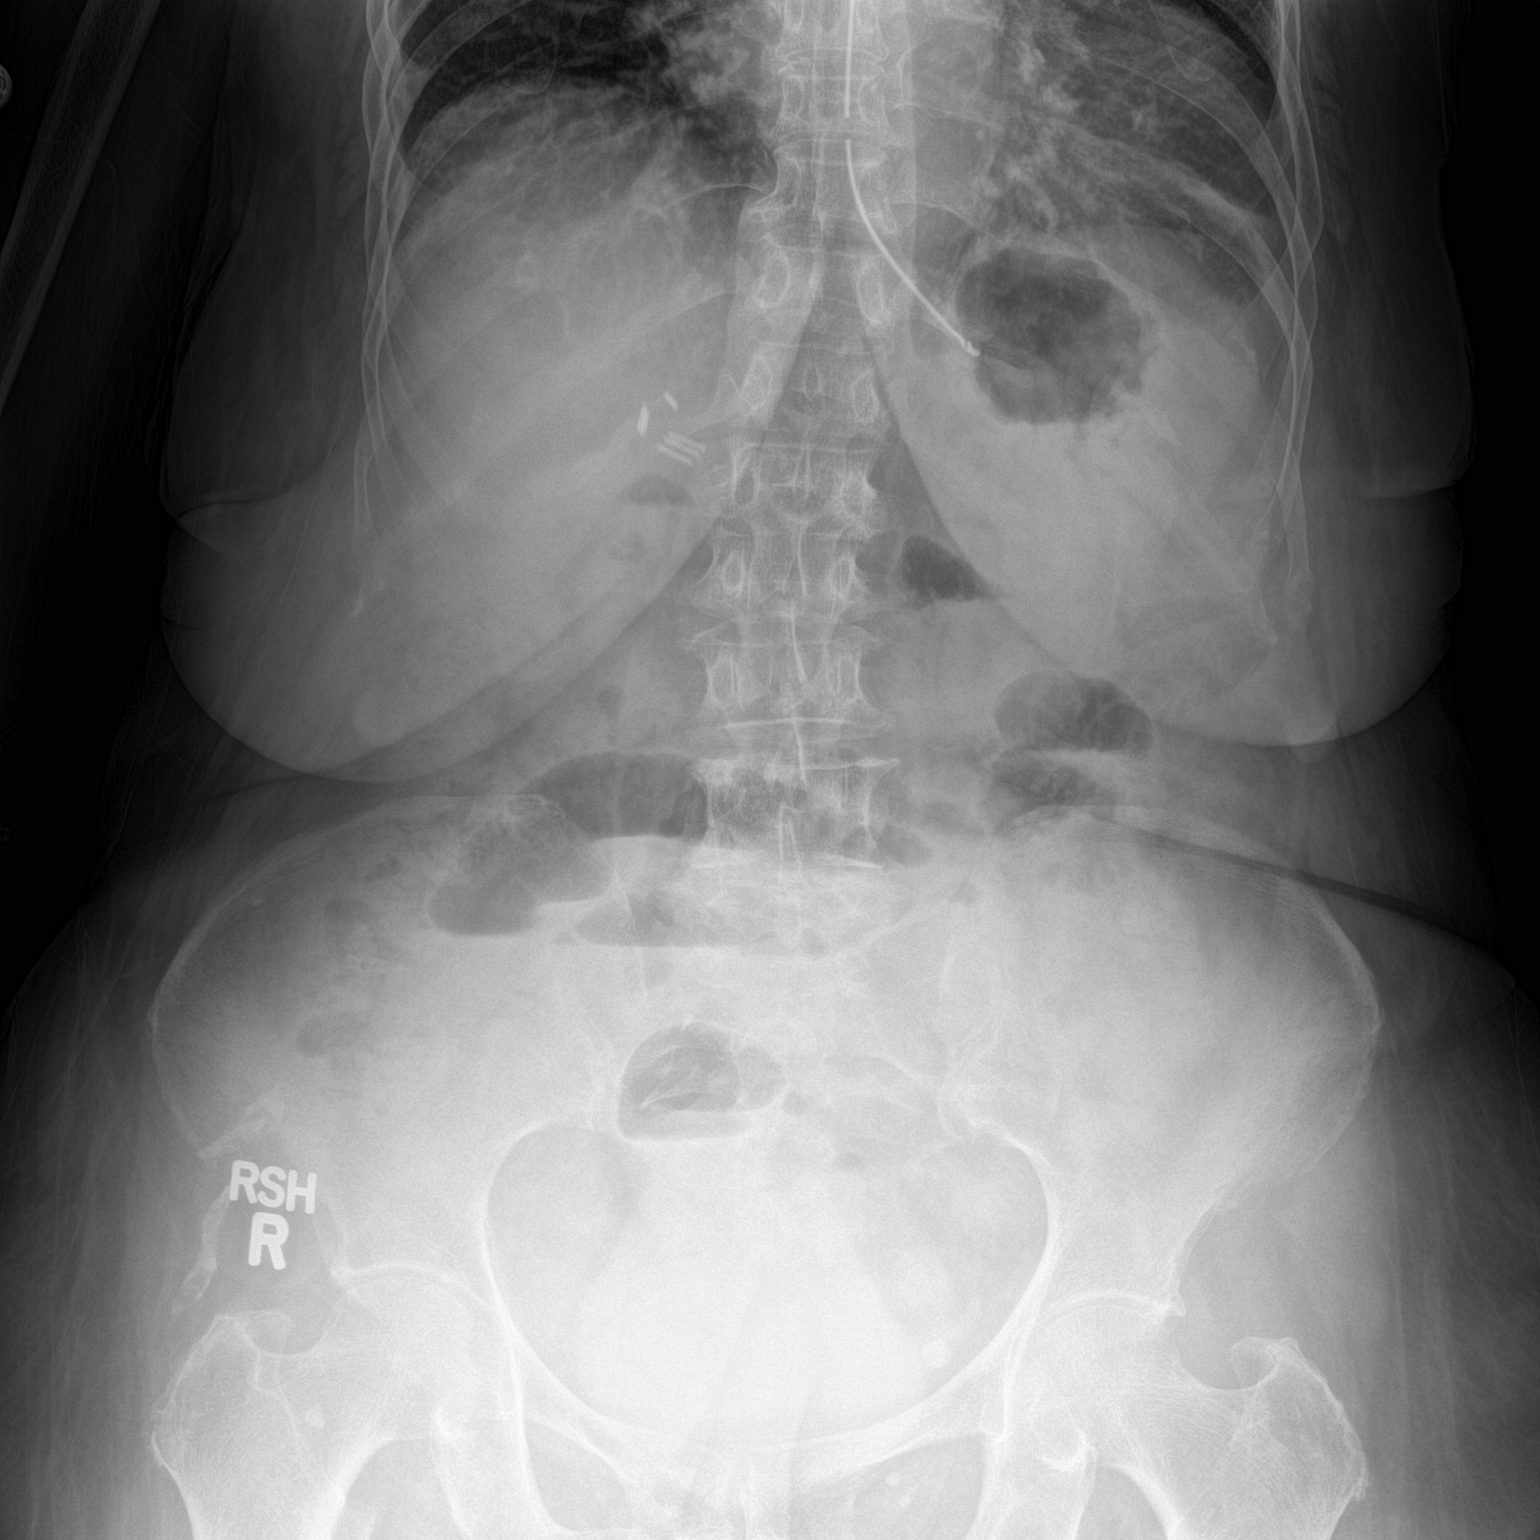

[abdomen supine]
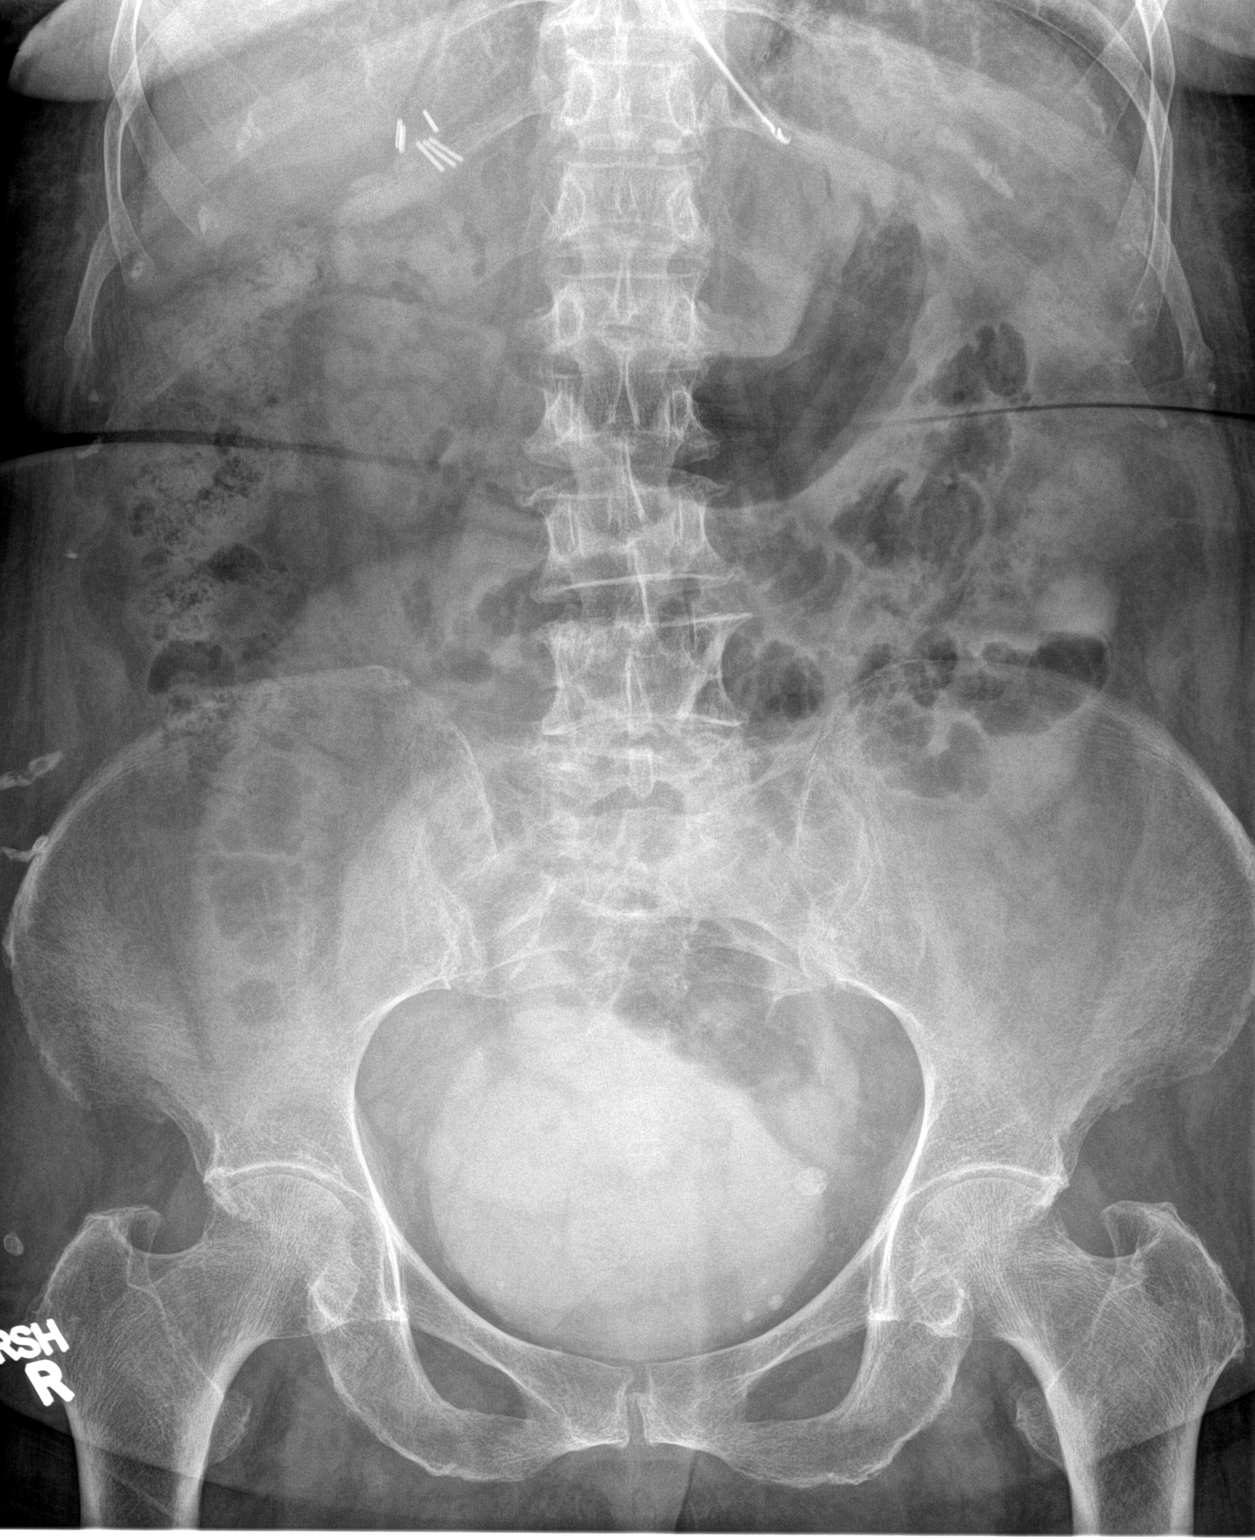

[2 of 2 positions shown; findings below may reference images not displayed]

FINDINGS: Nasogastric tube has been placed, projecting just below the
gastroesophageal junction. There are persistent air-fluid levels
throughout the small bowel. There is no evidence of pneumatosis or
free intraperitoneal air. Cholecystectomy clips and mild lumbar
spondylosis noted. contrast is present within the bladder from the
preceding CT.
IMPRESSION: Nasogastric tube tip is in the proximal stomach and should be
advanced. No significant change in small bowel obstruction pattern.

## 2016-11-09 ENCOUNTER — Encounter: Payer: Self-pay | Admitting: Family Medicine

## 2016-11-09 DIAGNOSIS — N200 Calculus of kidney: Secondary | ICD-10-CM | POA: Insufficient documentation

## 2017-04-19 ENCOUNTER — Ambulatory Visit (INDEPENDENT_AMBULATORY_CARE_PROVIDER_SITE_OTHER): Payer: Medicare Other

## 2017-04-19 ENCOUNTER — Encounter: Payer: Self-pay | Admitting: Family Medicine

## 2017-04-19 ENCOUNTER — Ambulatory Visit (INDEPENDENT_AMBULATORY_CARE_PROVIDER_SITE_OTHER): Payer: Medicare Other | Admitting: Family Medicine

## 2017-04-19 VITALS — BP 128/62 | HR 89 | Temp 98.2°F | Resp 17 | Ht <= 58 in | Wt 124.4 lb

## 2017-04-19 DIAGNOSIS — R0602 Shortness of breath: Secondary | ICD-10-CM | POA: Diagnosis not present

## 2017-04-19 DIAGNOSIS — E1165 Type 2 diabetes mellitus with hyperglycemia: Secondary | ICD-10-CM

## 2017-04-19 DIAGNOSIS — E039 Hypothyroidism, unspecified: Secondary | ICD-10-CM

## 2017-04-19 LAB — POCT GLYCOSYLATED HEMOGLOBIN (HGB A1C): Hemoglobin A1C: 6.9

## 2017-04-19 NOTE — Progress Notes (Signed)
10/9/20186:08 PM  Janet [name] [birth year], 83 y.o. female [id number]  Chief Complaint  Patient presents with  . New Patient (Initial Visit)    stomach issues-gas, diarrhea after eating, green and avocado, chamile tea, fruits and vegetable, any fast foods cause diarrhea, sob after walking short distances    HPI:   Patient is a 83 y.o. female with past medical history significant for DM type2, HTN and hypothyroidism who presents today to establish care.  She comes in today with her granddaughter.  Patient moved to [place] from [place] several years ago and since then has had very scattered care.  She used to take medications for DM and hypothyroidism. However she had severe nausea and vomiting when she was placed on generic glucotrol, per patient insurance denied [place] and she has been off meds since at least 2015 as she is now fearful that she will have serious adverse effects to changes in medications because of insurance coverage.  Her main concern today is worsening SOB and LE edema. Patient states this has been an issue for about a year with significant worsening for the past several months. Walking to her mailbox causes her to be very SOB. She has occasional chest pressure but denies pain. She endorses orthopnea and denies PND. She has a dry cough, no wheezing.   She does not check her cbgs anymore as she has felt them to be ok.   She has significant heartburn with occ nausea, denies vomiting or abdominal. She used to suffer from constipation but that has sign improved with dietary changes. Denies any black tarry stools or bright red blood in stools.  She denies any urinary symptoms.  Depression screen PHQ 2/9 04/19/2017  Decreased Interest 0  Down, Depressed, Hopeless 0  PHQ - 2 Score 0    Allergies  Allergen Reactions  . Other     Some generic medication    Prior to Admission medications   Medication Sig Start Date End Date Taking? Authorizing Provider  Multiple  Vitamins-Minerals (CENTRUM ADULTS PO) Take by mouth.   Yes [provider]  Multiple Vitamins-Minerals (ICAPS AREDS 2) CAPS Take by mouth.   Yes [provider]  levothyroxine (SYNTHROID, LEVOTHROID) 50 MCG tablet Take 50 mcg by mouth daily.    [provider]  OVER THE COUNTER MEDICATION Take 1 tablet by mouth daily. Diabetes Management Herbal Supplement    [provider]    Past Medical History:  Diagnosis Date  . Anemia   . Arthritis   . Asthma   . Diabetes mellitus    type 2  . Family history of adverse reaction to anesthesia    daughter has " a allergic reaction to anesthesia " does not know which one   . Gall stones   . Hypertension   . Kidney stones   . Thyroid disease     Past Surgical History:  Procedure Laterality Date  . ABDOMINAL HYSTERECTOMY    . CATARACT EXTRACTION Bilateral   . CHOLECYSTECTOMY    . HERNIA REPAIR     open    Social History  Substance Use Topics  . Smoking status: Never Smoker  . Smokeless tobacco: Never Used  . Alcohol use No    History reviewed. No pertinent family history.  Review of Systems  Constitutional: Negative for chills and fever.  Respiratory: Positive for cough and shortness of breath. Negative for sputum production and wheezing.   Cardiovascular: Positive for orthopnea and leg swelling. Negative for chest pain,  palpitations and PND.  Gastrointestinal: Positive for heartburn and nausea. Negative for abdominal pain, blood in stool, constipation, diarrhea, melena and vomiting.  Genitourinary: Negative for dysuria, frequency, hematuria and urgency.  Neurological: Negative for dizziness and tingling.  Endo/Heme/Allergies: Negative for polydipsia.     OBJECTIVE:  Blood pressure 128/62, pulse 89, temperature 98.2 F (36.8 C), temperature source Oral, resp. rate 17, height 4\' 6"  (1.372 m), weight 124 lb 6.4 oz (56.4 kg), SpO2 93 %.  Physical Exam  Constitutional: She is oriented to person,  place, and time and well-developed, well-nourished, and in no distress.  HENT:  Head: Normocephalic and atraumatic.  Mouth/Throat: Oropharynx is clear and moist. No oropharyngeal exudate.  Eyes: Pupils are equal, round, and reactive to light. EOM are normal. No scleral icterus.  Neck: Neck supple. No JVD present. No thyromegaly present.  Cardiovascular: Normal rate, regular rhythm and normal heart sounds.  Exam reveals no gallop and no friction rub.   No murmur heard. Pulmonary/Chest: Effort normal and breath sounds normal. She has no wheezes. She has no rales.  Abdominal: Soft. Bowel sounds are normal. She exhibits no distension and no mass. There is no tenderness.  Musculoskeletal: She exhibits edema.  Neurological: She is alert and oriented to person, place, and time. Gait normal.  Skin: Skin is warm and dry.  Psychiatric: Mood and affect normal.  Nursing note and vitals reviewed.   Results for orders placed or performed in visit on 04/19/17 (from the past 24 hour(s))  POCT glycosylated hemoglobin (Hb A1C)     Status: None   Collection Time: 04/19/17  5:23 PM  Result Value Ref Range   Hemoglobin A1C 6.9     Dg Chest 2 View  Result Date: 04/19/2017 CLINICAL DATA:  Worsening chronic dyspnea EXAM: CHEST  2 VIEW COMPARISON:  01/13/2011 chest radiograph. FINDINGS: Increased moderate cardiomegaly. Aortic atherosclerosis. Otherwise normal mediastinal contour. No pneumothorax. No pleural effusion. Cephalization of the pulmonary vasculature without overt pulmonary edema. No acute consolidative airspace disease. Small linear opacity in the right mid lung. IMPRESSION: 1. Increased moderate cardiomegaly. No overt pulmonary edema. No pleural effusions. 2. Small linear opacity in the right mid lung, which may represent minimal fluid in the minor fissure versus minimal scarring or atelectasis. Electronically Signed   By: Ilona Sorrel M.D.   On: 04/19/2017 17:29   EKG - NSR, HR 68, LAD w  LAFB.  ASSESSMENT and PLAN  1. SOB (shortness of breath) Symptoms, exam and CXR suggestive of CHF wo overt pulm edema.Also might be exacerbated by possibly untreated hypothyroidism. Given HD stable, chronicity of symptoms and advanced age will defer treatment until labs and echo results are back. Did discuss daily weight, low salt diet and mild fluid restriction. Discussed ER precautions.  - EKG 12-Lead - CBC with Differential - Comprehensive metabolic panel - Brain natriuretic peptide - DG Chest 2 View; Future - ECHOCARDIOGRAM COMPLETE; Future  2. Hypothyroidism, unspecified type Patient has been off medication for quite sometime now, will check TSH and initiate medication as needed, if indeed.  - TSH  3. Type 2 diabetes mellitus with hyperglycemia, without long-term current use of insulin (HCC) Diet controlled.  - Microalbumin, urine - POCT glycosylated hemoglobin (Hb A1C)   Return in about 1 week (around 04/26/2017).    Rutherford Guys, MD [facility] [place],  [address] Ph.  [phone] Fax [phone]

## 2017-04-19 NOTE — Patient Instructions (Signed)
     IF you received an x-ray today, you will receive an invoice from [facility]. Please contact [facility] at [phone] with questions or concerns regarding your invoice.   IF you received labwork today, you will receive an invoice from [facility]. Please contact [facility] at [phone] with questions or concerns regarding your invoice.   Our billing staff will not be able to assist you with questions regarding bills from these companies.  You will be contacted with the lab results as soon as they are available. The fastest way to get your results is to activate your My Chart account. Instructions are located on the last page of this paperwork. If you have not heard from us regarding the results in 2 weeks, please contact this office.     

## 2017-04-20 ENCOUNTER — Telehealth: Payer: Self-pay | Admitting: *Deleted

## 2017-04-20 ENCOUNTER — Telehealth: Payer: Self-pay

## 2017-04-20 ENCOUNTER — Encounter (HOSPITAL_COMMUNITY): Payer: Self-pay | Admitting: Emergency Medicine

## 2017-04-20 ENCOUNTER — Observation Stay (HOSPITAL_COMMUNITY)
Admission: EM | Admit: 2017-04-20 | Discharge: 2017-04-22 | Disposition: A | Discharge status: Home / Self Care | Payer: Medicare Other | Attending: Internal Medicine | Admitting: Internal Medicine

## 2017-04-20 ENCOUNTER — Emergency Department (HOSPITAL_COMMUNITY): Payer: Medicare Other

## 2017-04-20 DIAGNOSIS — I120 Hypertensive chronic kidney disease with stage 5 chronic kidney disease or end stage renal disease: Secondary | ICD-10-CM | POA: Insufficient documentation

## 2017-04-20 DIAGNOSIS — N185 Chronic kidney disease, stage 5: Secondary | ICD-10-CM

## 2017-04-20 DIAGNOSIS — D631 Anemia in chronic kidney disease: Secondary | ICD-10-CM | POA: Insufficient documentation

## 2017-04-20 DIAGNOSIS — Z66 Do not resuscitate: Secondary | ICD-10-CM | POA: Insufficient documentation

## 2017-04-20 DIAGNOSIS — D649 Anemia, unspecified: Secondary | ICD-10-CM | POA: Diagnosis present

## 2017-04-20 DIAGNOSIS — E118 Type 2 diabetes mellitus with unspecified complications: Secondary | ICD-10-CM | POA: Diagnosis not present

## 2017-04-20 DIAGNOSIS — E119 Type 2 diabetes mellitus without complications: Secondary | ICD-10-CM

## 2017-04-20 DIAGNOSIS — J45909 Unspecified asthma, uncomplicated: Secondary | ICD-10-CM | POA: Diagnosis not present

## 2017-04-20 DIAGNOSIS — I10 Essential (primary) hypertension: Secondary | ICD-10-CM | POA: Diagnosis not present

## 2017-04-20 DIAGNOSIS — N189 Chronic kidney disease, unspecified: Secondary | ICD-10-CM

## 2017-04-20 DIAGNOSIS — E1122 Type 2 diabetes mellitus with diabetic chronic kidney disease: Secondary | ICD-10-CM | POA: Diagnosis not present

## 2017-04-20 DIAGNOSIS — M1990 Unspecified osteoarthritis, unspecified site: Secondary | ICD-10-CM | POA: Diagnosis not present

## 2017-04-20 DIAGNOSIS — N281 Cyst of kidney, acquired: Secondary | ICD-10-CM | POA: Insufficient documentation

## 2017-04-20 DIAGNOSIS — N289 Disorder of kidney and ureter, unspecified: Secondary | ICD-10-CM

## 2017-04-20 DIAGNOSIS — E039 Hypothyroidism, unspecified: Secondary | ICD-10-CM | POA: Diagnosis not present

## 2017-04-20 LAB — COMPREHENSIVE METABOLIC PANEL
ALBUMIN: 4 g/dL (ref 3.5–5.0)
ALT: 14 IU/L (ref 0–32)
ALT: 17 U/L (ref 14–54)
AST: 32 IU/L (ref 0–40)
AST: 30 U/L (ref 15–41)
Albumin/Globulin Ratio: 1.6 (ref 1.2–2.2)
Albumin: 4.1 g/dL (ref 3.2–4.6)
Alkaline Phosphatase: 70 IU/L (ref 39–117)
Alkaline Phosphatase: 63 U/L (ref 38–126)
Anion gap: 11 (ref 5–15)
BUN/Creatinine Ratio: 13 (ref 12–28)
BUN: 44 mg/dL — ABNORMAL HIGH (ref 10–36)
BUN: 54 mg/dL — ABNORMAL HIGH (ref 6–20)
Bilirubin Total: 0.3 mg/dL (ref 0.0–1.2)
CO2: 22 mmol/L (ref 20–29)
CO2: 22 mmol/L (ref 22–32)
Calcium: 8.5 mg/dL — ABNORMAL LOW (ref 8.7–10.3)
Calcium: 8.7 mg/dL — ABNORMAL LOW (ref 8.9–10.3)
Chloride: 103 mmol/L (ref 96–106)
Chloride: 106 mmol/L (ref 101–111)
Creatinine, Ser: 3.27 mg/dL (ref 0.57–1.00)
Creatinine, Ser: 3.17 mg/dL — ABNORMAL HIGH (ref 0.44–1.00)
GFR calc Af Amer: 14 mL/min/{1.73_m2} — ABNORMAL LOW (ref >59)
GFR calc non Af Amer: 12 mL/min/{1.73_m2} — ABNORMAL LOW (ref >59)
GFR calc non Af Amer: 12 mL/min — ABNORMAL LOW (ref >60)
GFR, EST AFRICAN AMERICAN: 14 mL/min — AB (ref >60)
GLUCOSE: 108 mg/dL — AB (ref 65–99)
Globulin, Total: 2.6 g/dL (ref 1.5–4.5)
Glucose: 109 mg/dL — ABNORMAL HIGH (ref 65–99)
POTASSIUM: 4.5 mmol/L (ref 3.5–5.1)
Potassium: 5.3 mmol/L — ABNORMAL HIGH (ref 3.5–5.2)
Sodium: 140 mmol/L (ref 134–144)
Sodium: 139 mmol/L (ref 135–145)
TOTAL PROTEIN: 7 g/dL (ref 6.5–8.1)
Total Bilirubin: 0.6 mg/dL (ref 0.3–1.2)
Total Protein: 6.7 g/dL (ref 6.0–8.5)

## 2017-04-20 LAB — URINALYSIS, ROUTINE W REFLEX MICROSCOPIC
Bacteria, UA: NONE SEEN
Bilirubin Urine: NEGATIVE
GLUCOSE, UA: 50 mg/dL — AB
Hgb urine dipstick: NEGATIVE
Ketones, ur: NEGATIVE mg/dL
LEUKOCYTES UA: NEGATIVE
Nitrite: NEGATIVE
PH: 6 (ref 5.0–8.0)
Protein, ur: >=300 mg/dL — AB
Specific Gravity, Urine: 1.01 (ref 1.005–1.030)

## 2017-04-20 LAB — CBC WITH DIFFERENTIAL/PLATELET
BASOS PCT: 0 %
Basophils Absolute: 0 10*3/uL (ref 0.0–0.2)
Basophils Absolute: 0 10*3/uL (ref 0.0–0.1)
Basos: 0 %
EOS (ABSOLUTE): 0.2 10*3/uL (ref 0.0–0.4)
Eos: 3 %
Eosinophils Absolute: 0.2 10*3/uL (ref 0.0–0.7)
Eosinophils Relative: 3 %
HEMATOCRIT: 20.3 % — AB (ref 36.0–46.0)
Hematocrit: 19.6 % — ABNORMAL LOW (ref 34.0–46.6)
Hemoglobin: 6.4 g/dL — CL (ref 11.1–15.9)
Hemoglobin: 6.5 g/dL — CL (ref 12.0–15.0)
Immature Grans (Abs): 0 10*3/uL (ref 0.0–0.1)
Immature Granulocytes: 0 %
Lymphocytes Absolute: 1.6 10*3/uL (ref 0.7–3.1)
Lymphocytes Relative: 28 %
Lymphs Abs: 1.9 10*3/uL (ref 0.7–4.0)
Lymphs: 24 %
MCH: 27.8 pg (ref 26.6–33.0)
MCH: 28.8 pg (ref 26.0–34.0)
MCHC: 32.7 g/dL (ref 31.5–35.7)
MCHC: 32 g/dL (ref 30.0–36.0)
MCV: 85 fL (ref 79–97)
MCV: 89.8 fL (ref 78.0–100.0)
Monocytes Absolute: 0.5 10*3/uL (ref 0.1–0.9)
Monocytes Absolute: 0.4 10*3/uL (ref 0.1–1.0)
Monocytes Relative: 6 %
Monocytes: 7 %
NEUTROS ABS: 4.4 10*3/uL (ref 1.7–7.7)
Neutrophils Absolute: 4.5 10*3/uL (ref 1.4–7.0)
Neutrophils Relative %: 63 %
Neutrophils: 66 %
Platelets: 473 10*3/uL — ABNORMAL HIGH (ref 150–379)
Platelets: 480 10*3/uL — ABNORMAL HIGH (ref 150–400)
RBC: 2.3 x10E6/uL — CL (ref 3.77–5.28)
RBC: 2.26 MIL/uL — AB (ref 3.87–5.11)
RDW: 18.6 % — ABNORMAL HIGH (ref 12.3–15.4)
RDW: 17.8 % — AB (ref 11.5–15.5)
WBC: 6.9 10*3/uL (ref 3.4–10.8)
WBC: 7 10*3/uL (ref 4.0–10.5)

## 2017-04-20 LAB — TSH
TSH: 50.4 u[IU]/mL — ABNORMAL HIGH (ref 0.450–4.500)
TSH: 38.346 u[IU]/mL — ABNORMAL HIGH (ref 0.350–4.500)

## 2017-04-20 LAB — ABO/RH: ABO/RH(D): O POS

## 2017-04-20 LAB — POC OCCULT BLOOD, ED: FECAL OCCULT BLD: NEGATIVE

## 2017-04-20 LAB — T4, FREE: Free T4: 0.68 ng/dL (ref 0.61–1.12)

## 2017-04-20 LAB — I-STAT TROPONIN, ED: Troponin i, poc: 0.04 ng/mL (ref 0.00–0.08)

## 2017-04-20 LAB — GLUCOSE, CAPILLARY: Glucose-Capillary: 172 mg/dL — ABNORMAL HIGH (ref 65–99)

## 2017-04-20 LAB — PREPARE RBC (CROSSMATCH)

## 2017-04-20 LAB — MICROALBUMIN, URINE: Microalbumin, Urine: 2084 ug/mL

## 2017-04-20 LAB — BRAIN NATRIURETIC PEPTIDE
B Natriuretic Peptide: 623.6 pg/mL — ABNORMAL HIGH (ref 0.0–100.0)
BNP: 401.3 pg/mL — ABNORMAL HIGH (ref 0.0–100.0)

## 2017-04-20 MED ORDER — HYDRALAZINE HCL 20 MG/ML IJ SOLN
5.0000 mg | Freq: Four times a day (QID) | INTRAMUSCULAR | Status: DC | PRN
  Administered 2017-04-20: 5 mg via INTRAVENOUS
  Filled 2017-04-20 (×2): qty 1

## 2017-04-20 MED ORDER — HYDRALAZINE HCL 20 MG/ML IJ SOLN
5.0000 mg | Freq: Once | INTRAMUSCULAR | Status: AC
  Administered 2017-04-20: 5 mg via INTRAVENOUS

## 2017-04-20 MED ORDER — ENSURE ENLIVE PO LIQD
237.0000 mL | Freq: Two times a day (BID) | ORAL | Status: DC
  Administered 2017-04-21 – 2017-04-22 (×4): 237 mL via ORAL

## 2017-04-20 MED ORDER — CARVEDILOL 3.125 MG PO TABS
3.1250 mg | ORAL_TABLET | Freq: Two times a day (BID) | ORAL | Status: DC
  Administered 2017-04-20 – 2017-04-21 (×3): 3.125 mg via ORAL
  Filled 2017-04-20 (×4): qty 1

## 2017-04-20 MED ORDER — AMLODIPINE BESYLATE 5 MG PO TABS
5.0000 mg | ORAL_TABLET | Freq: Every day | ORAL | Status: DC
  Administered 2017-04-21: 5 mg via ORAL
  Filled 2017-04-20 (×2): qty 1

## 2017-04-20 MED ORDER — SODIUM CHLORIDE 0.9 % IV SOLN: Freq: Once | INTRAVENOUS | Status: DC

## 2017-04-20 MED ORDER — LEVOTHYROXINE SODIUM 50 MCG PO TABS
50.0000 ug | ORAL_TABLET | Freq: Every day | ORAL | Status: DC
  Administered 2017-04-21: 50 ug via ORAL
  Filled 2017-04-20: qty 1

## 2017-04-20 MED ORDER — INSULIN ASPART 100 UNIT/ML ~~LOC~~ SOLN: 0.0000 [IU] | Freq: Three times a day (TID) | SUBCUTANEOUS | Status: DC

## 2017-04-20 NOTE — ED Notes (Addendum)
Pt ambulated to the BR without difficulty.  Denies any dizziness.  Pt states she does not like to lay down and wants to walk around.  Pt made aware that she can have dizziness d/t the low Hgb.  Family remains at bedside.

## 2017-04-20 NOTE — ED Notes (Signed)
ED Provider at bedside. 

## 2017-04-20 NOTE — Telephone Encounter (Signed)
Spoke with patient and advised her to go to the Emergency for low hemoglobin elevated creatinine results. Pt did not fully understand and refused. I called both emergency contacts and left voicemails for pt granddaughters. Pt refused for me to call 911 on her behalf.

## 2017-04-20 NOTE — ED Notes (Signed)
Hospitalist at bedside 

## 2017-04-20 NOTE — Progress Notes (Signed)
Pt BP 202/62.  Pt asymptomatic, denies HA, CP.  Refusing PO meds because they are generic.  MD Dr. Florene Glen informed and will be up here shortly to talk with pt and family member.

## 2017-04-20 NOTE — ED Triage Notes (Signed)
Pt sent by MD's office for anemia. Pt was new pt at MD's office and had labs, pt's RBC: 2.30 and Hgb: 6.4. Pt states she has had some shortness of breath and fatigue over the past few weeks. Denies pain.

## 2017-04-20 NOTE — ED Notes (Signed)
Call report to [facility] [phone] @ [phone]

## 2017-04-20 NOTE — Plan of Care (Signed)
Problem: Spiritual Needs Goal: Ability to function at adequate level Outcome: Progressing Ordered chaplain consult to provide information regarding an advance directive.

## 2017-04-20 NOTE — ED Provider Notes (Addendum)
[place] DEPT Provider Note   CSN: [id number] Arrival date & time: 04/20/17  1128     History   Chief Complaint Chief Complaint  Patient presents with  . Anemia    HPI Janet [name] is a 83 y.o. female.  Patient is a 83 year old female with a history of diabetes, hypertension, thyroid disease, anemia presenting today with abnormal blood work. Patient has not seen a doctor for over a year and had stopped taking all of her medications. She was using herbal medications approximately 6 months ago started to notice gradually worsening fatigue. In the last few weeks symptoms have significantly worsened with exertional dyspnea, sensation of her heart eating hard but no true chest pain, fatigue. She saw a doctor today for the first time which will be a new physician for her and was found to have a hemoglobin of 6.4. Patient does have a history of anemia but unclear how low it's been in the past. She currently is denying any chest pain or shortness of breath at rest. Does not take any anticoagulation.   The history is provided by the patient and a relative.  Anemia     Past Medical History:  Diagnosis Date  . Anemia   . Arthritis   . Asthma   . Diabetes mellitus    type 2  . Family history of adverse reaction to anesthesia    daughter has " a allergic reaction to anesthesia " does not know which one   . Gall stones   . Hypertension   . Kidney stones   . Thyroid disease     Patient Active Problem List   Diagnosis Date Noted  . Kidney stones   . SBO (small bowel obstruction) ([place]) 06/27/2014  . Hypothyroidism 06/27/2014  . Benign essential HTN 06/27/2014  . DM type 2 (diabetes mellitus, type 2) ([place]) 06/27/2014  . Dilation of biliary tract 06/27/2014    Past Surgical History:  Procedure Laterality Date  . ABDOMINAL HYSTERECTOMY    . CATARACT EXTRACTION Bilateral   . CHOLECYSTECTOMY    . HERNIA REPAIR     open    OB History    No data available        Home Medications    Prior to Admission medications   Medication Sig Start Date End Date Taking? Authorizing Provider  ibuprofen (ADVIL,MOTRIN) 200 MG tablet Take 400 mg by mouth daily as needed for mild pain.   Yes [provider]  Multiple Vitamins-Minerals (CENTRUM ADULTS PO) Take by mouth.   Yes [provider]  Multiple Vitamins-Minerals (ICAPS AREDS 2) CAPS Take by mouth.   Yes [provider]  OVER THE COUNTER MEDICATION Take 1 tablet by mouth daily. Diabetes Management Herbal Supplement   Yes [provider]  Tetrahydrozoline HCl (VISINE OP) Place 1-2 drops into both eyes daily as needed (watery eyes).   Yes [provider]    Family History History reviewed. No pertinent family history.  Social History Social History  Substance Use Topics  . Smoking status: Never Smoker  . Smokeless tobacco: Never Used  . Alcohol use No     Allergies   Other   Review of Systems Review of Systems  All other systems reviewed and are negative.    Physical Exam Updated Vital Signs BP (!) 184/70 (BP Location: Right Arm)   Pulse 76   Temp 98.2 F (36.8 C) (Oral)   Resp 16   SpO2 96%   Physical Exam  Constitutional: She  is oriented to person, place, and time. She appears well-developed and well-nourished. No distress.  HENT:  Head: Normocephalic and atraumatic.  Mouth/Throat: Oropharynx is clear and moist.  Eyes: Pupils are equal, round, and reactive to light. Conjunctivae and EOM are normal.  Neck: Normal range of motion. Neck supple.  Cardiovascular: Normal rate, regular rhythm and intact distal pulses.   No murmur heard. Pulmonary/Chest: Effort normal and breath sounds normal. No respiratory distress. She has no wheezes. She has no rales.  Abdominal: Soft. She exhibits no distension. There is no tenderness. There is no rebound and no guarding.  Genitourinary: Rectal exam shows guaiac negative stool.  Musculoskeletal: Normal  range of motion. She exhibits no edema or tenderness.  Neurological: She is alert and oriented to person, place, and time.  Skin: Skin is warm and dry. No rash noted. No erythema. There is pallor.  Psychiatric: She has a normal mood and affect. Her behavior is normal.  Nursing note and vitals reviewed.    ED Treatments / Results  Labs (all labs ordered are listed, but only abnormal results are displayed) Labs Reviewed  CBC WITH DIFFERENTIAL/PLATELET - Abnormal; Notable for the following:       Result Value   RBC 2.26 (*)    Hemoglobin 6.5 (*)    HCT 20.3 (*)    RDW 17.8 (*)    Platelets 480 (*)    All other components within normal limits  COMPREHENSIVE METABOLIC PANEL - Abnormal; Notable for the following:    Glucose, Bld 108 (*)    BUN 54 (*)    Creatinine, Ser 3.17 (*)    Calcium 8.7 (*)    GFR calc non Af Amer 12 (*)    GFR calc Af Amer 14 (*)    All other components within normal limits  I-STAT TROPONIN, ED  POC OCCULT BLOOD, ED  TYPE AND SCREEN  ABO/RH  PREPARE RBC (CROSSMATCH)    EKG  EKG Interpretation  Date/Time:  Wednesday April 20 2017 12:30:39 EDT Ventricular Rate:  72 PR Interval:    QRS Duration: 88 QT Interval:  401 QTC Calculation: 439 R Axis:   -42 Text Interpretation:  Sinus rhythm Left axis deviation No significant change since last tracing Confirmed by Blanchie Dessert [phone]) on 04/20/2017 12:50:40 PM       Radiology Dg Chest 2 View  Result Date: 04/19/2017 CLINICAL DATA:  Worsening chronic dyspnea EXAM: CHEST  2 VIEW COMPARISON:  01/13/2011 chest radiograph. FINDINGS: Increased moderate cardiomegaly. Aortic atherosclerosis. Otherwise normal mediastinal contour. No pneumothorax. No pleural effusion. Cephalization of the pulmonary vasculature without overt pulmonary edema. No acute consolidative airspace disease. Small linear opacity in the right mid lung. IMPRESSION: 1. Increased moderate cardiomegaly. No overt pulmonary edema. No pleural  effusions. 2. Small linear opacity in the right mid lung, which may represent minimal fluid in the minor fissure versus minimal scarring or atelectasis. Electronically Signed   By: Ilona Sorrel M.D.   On: 04/19/2017 17:29    Procedures Procedures (including critical care time)  Medications Ordered in ED Medications - No data to display   Initial Impression / Assessment and Plan / ED Course  I have reviewed the triage vital signs and the nursing notes.  Pertinent labs & imaging results that were available during my care of the patient were reviewed by me and considered in my medical decision making (see chart for details).     Elderly female presenting with some shortness of breath, fatigue and blood counts  today with a hemoglobin of 6.5 from her doctor's office. Patient is hemodynamically stable on arrival. She is in no acute distress. Exam without significant findings except for the patient is pale. Hemoccult is negative. EKG is unchanged.  CXR with cardiomegaly. CBC, troponin, BMP and typing screen pending  2:12 PM Patient found to have anemia with a hemoglobin of 6.5, no kidney insufficiency with a creatinine of 3.17 from prior results 3 years ago in the ones. Hemoccult was negative. This possible the patient has had gradually worsening renal function which has led to her anemia. Will admit for blood transfusion and further workup for kidney disease.  CRITICAL CARE Performed by: Blanchie Dessert Total critical care time: 30 minutes Critical care time was exclusive of separately billable procedures and treating other patients. Critical care was necessary to treat or prevent imminent or life-threatening deterioration. Critical care was time spent personally by me on the following activities: development of treatment plan with patient and/or surrogate as well as nursing, discussions with consultants, evaluation of patient's response to treatment, examination of patient, obtaining history  from patient or surrogate, ordering and performing treatments and interventions, ordering and review of laboratory studies, ordering and review of radiographic studies, pulse oximetry and re-evaluation of patient's condition.  Final Clinical Impressions(s) / ED Diagnoses   Final diagnoses:  Anemia, unspecified type  Kidney disease    New Prescriptions New Prescriptions   No medications on file     Blanchie Dessert, MD 04/20/17 1413    Blanchie Dessert, MD 04/20/17 1413

## 2017-04-20 NOTE — ED Notes (Signed)
Date and time results received: 04/20/17 1:11 PM   Test: hemoglobin Critical Value: 6.5  Name of Provider Notified: Plunkett  Orders Received? Or Actions Taken?: MD Plunkett made aware, waiting for orders

## 2017-04-20 NOTE — ED Notes (Signed)
ED TO INPATIENT HANDOFF REPORT  Name/Age/Gender Janet [name] 83 y.o. female  Code Status Code Status History    Date Active Date Inactive Code Status Order ID Comments User Context   06/27/2014 10:14 AM 06/28/2014  3:32 PM Full Code [id number]  Jonetta Osgood, MD Inpatient      Home/SNF/Other home  Chief Complaint Abn Labs  Level of Care/Admitting Diagnosis ED Disposition    ED Disposition Condition [place]: [facility] [[id number]]  Level of Care: Telemetry [5]  Admit to tele based on following criteria: Other see comments  Comments: abnormal EKG  Diagnosis: Anemia [419622]  Admitting Physician: Elodia Florence [phone]  Attending Physician: Cephus Slater, A CALDWELL [phone]  PT Class (Do Not Modify): Observation [104]  PT Acc Code (Do Not Modify): Observation [10022]       Medical History Past Medical History:  Diagnosis Date  . Anemia   . Arthritis   . Asthma   . Diabetes mellitus    type 2  . Family history of adverse reaction to anesthesia    daughter has " a allergic reaction to anesthesia " does not know which one   . Gall stones   . Hypertension   . Kidney stones   . Thyroid disease     Allergies Allergies  Allergen Reactions  . Other     Some generic medication, can only take most brand name medications    IV Location/Drains/Wounds Patient Lines/Drains/Airways Status   Active Line/Drains/Airways    Name:   Placement date:   Placement time:   Site:   Days:   Peripheral IV 04/20/17 Right Antecubital  04/20/17    1243    Antecubital    less than 1          Labs/Imaging Results for orders placed or performed during the hospital encounter of 04/20/17 (from the past 48 hour(s))  POC occult blood, ED     Status: None   Collection Time: 04/20/17 12:29 PM  Result Value Ref Range   Fecal Occult Bld NEGATIVE NEGATIVE  Type and screen     Status: None (Preliminary result)   Collection Time:  04/20/17 12:34 PM  Result Value Ref Range   ABO/RH(D) O POS    Antibody Screen NEG    Sample Expiration 04/23/2017    Unit Number [id number]    Blood Component Type RED CELLS,LR    Unit division 00    Status of Unit ALLOCATED    Transfusion Status OK TO TRANSFUSE    Crossmatch Result Compatible    Unit Number [id number]    Blood Component Type RED CELLS,LR    Unit division 00    Status of Unit ALLOCATED    Transfusion Status OK TO TRANSFUSE    Crossmatch Result Compatible   ABO/Rh     Status: None   Collection Time: 04/20/17 12:34 PM  Result Value Ref Range   ABO/RH(D) O POS   CBC with Differential/Platelet     Status: Abnormal   Collection Time: 04/20/17 12:37 PM  Result Value Ref Range   WBC 7.0 4.0 - 10.5 K/uL   RBC 2.26 (L) 3.87 - 5.11 MIL/uL   Hemoglobin 6.5 (LL) 12.0 - 15.0 g/dL    Comment: REPEATED TO VERIFY CRITICAL RESULT CALLED TO, READ BACK BY AND VERIFIED WITH: Finian Helvey,K @ 1311 ON [id number] BY POTEAT,S    HCT 20.3 (L) 36.0 - 46.0 %   MCV 89.8 78.0 -  100.0 fL   MCH 28.8 26.0 - 34.0 pg   MCHC 32.0 30.0 - 36.0 g/dL   RDW 17.8 (H) 11.5 - 15.5 %   Platelets 480 (H) 150 - 400 K/uL   Neutrophils Relative % 63 %   Neutro Abs 4.4 1.7 - 7.7 K/uL   Lymphocytes Relative 28 %   Lymphs Abs 1.9 0.7 - 4.0 K/uL   Monocytes Relative 6 %   Monocytes Absolute 0.4 0.1 - 1.0 K/uL   Eosinophils Relative 3 %   Eosinophils Absolute 0.2 0.0 - 0.7 K/uL   Basophils Relative 0 %   Basophils Absolute 0.0 0.0 - 0.1 K/uL  Comprehensive metabolic panel     Status: Abnormal   Collection Time: 04/20/17 12:37 PM  Result Value Ref Range   Sodium 139 135 - 145 mmol/L   Potassium 4.5 3.5 - 5.1 mmol/L   Chloride 106 101 - 111 mmol/L   CO2 22 22 - 32 mmol/L   Glucose, Bld 108 (H) 65 - 99 mg/dL   BUN 54 (H) 6 - 20 mg/dL   Creatinine, Ser 3.17 (H) 0.44 - 1.00 mg/dL   Calcium 8.7 (L) 8.9 - 10.3 mg/dL   Total Protein 7.0 6.5 - 8.1 g/dL   Albumin 4.0 3.5 - 5.0 g/dL   AST 30 15 - 41 U/L    ALT 17 14 - 54 U/L   Alkaline Phosphatase 63 38 - 126 U/L   Total Bilirubin 0.6 0.3 - 1.2 mg/dL   GFR calc non Af Amer 12 (L) >60 mL/min   GFR calc Af Amer 14 (L) >60 mL/min    Comment: (NOTE) The eGFR has been calculated using the CKD EPI equation. This calculation has not been validated in all clinical situations. eGFR's persistently <60 mL/min signify possible Chronic Kidney Disease.    Anion gap 11 5 - 15  I-stat troponin, ED     Status: None   Collection Time: 04/20/17 12:53 PM  Result Value Ref Range   Troponin i, poc 0.04 0.00 - 0.08 ng/mL   Comment 3            Comment: Due to the release kinetics of cTnI, a negative result within the first hours of the onset of symptoms does not rule out myocardial infarction with certainty. If myocardial infarction is still suspected, repeat the test at appropriate intervals.   Prepare RBC     Status: None   Collection Time: 04/20/17  2:30 PM  Result Value Ref Range   Order Confirmation ORDER PROCESSED BY BLOOD BANK    Dg Chest 2 View  Result Date: 04/20/2017 CLINICAL DATA:  Shortness of breath, anemia EXAM: CHEST  2 VIEW COMPARISON:  08/20/2016 FINDINGS: Cardiomegaly with vascular congestion. No confluent opacities or effusions. No overt edema. No acute bony abnormality. IMPRESSION: Cardiomegaly, vascular congestion. Electronically Signed   By: Rolm Baptise M.D.   On: 04/20/2017 14:17   Dg Chest 2 View  Result Date: 04/19/2017 CLINICAL DATA:  Worsening chronic dyspnea EXAM: CHEST  2 VIEW COMPARISON:  01/13/2011 chest radiograph. FINDINGS: Increased moderate cardiomegaly. Aortic atherosclerosis. Otherwise normal mediastinal contour. No pneumothorax. No pleural effusion. Cephalization of the pulmonary vasculature without overt pulmonary edema. No acute consolidative airspace disease. Small linear opacity in the right mid lung. IMPRESSION: 1. Increased moderate cardiomegaly. No overt pulmonary edema. No pleural effusions. 2. Small linear  opacity in the right mid lung, which may represent minimal fluid in the minor fissure versus minimal scarring or atelectasis. Electronically  Signed   By: Ilona Sorrel M.D.   On: 04/19/2017 17:29    Pending Labs [employer]     Ordered   Signed and Held  TSH  Once,   R     Signed and Held   Signed and Held  Brain natriuretic peptide  Once,   R     Signed and Held      Vitals/Pain Today's Vitals   04/20/17 1203 04/20/17 1345 04/20/17 1551  BP: (!) 184/70 (!) 198/75 (!) 200/72  Pulse: 76 66 70  Resp: '16 16 14  ' Temp: 98.2 F (36.8 C)  98.3 F (36.8 C)  TempSrc: Oral  Oral  SpO2: 96% 99% 97%    Isolation Precautions No active isolations  Medications Medications  0.9 %  sodium chloride infusion (not administered)    Mobility walks

## 2017-04-20 NOTE — H&P (Signed)
History and Physical    Janet [name] ZDG:[id number] DOB: [birth year] DOA: 04/20/2017  PCP: Rutherford Guys, MD  Patient coming from: home  I have personally briefly reviewed patient's old medical records in [place]  Chief Complaint: abnormal labs  HPI: Janet [name] is Janet [name] 83 y.o. female with medical history significant of diabetes, high blood pressure, anemia, gallstones, hypothyroidism presenting with shortness of breath on exertion and abnormal labs.  She saw new PCP yesterday who ordered labs and she was found to have an elevated creatinine as well as anemia and hypothyroidism. She was called and advised to present to the emergency department.  Currently Janet [name] is asymptomatic. She said that she was feeling good on the morning of admission. She does note that she's had shortness of breath on exertion for the past 2 months. She denies any chest pain or shortness of breath currently. She does say that she often wakes up at night short of breath and needs to sleep on 2 pillows. She notes some bilateral lower extremity edema as well.  She denies any fevers. She denies any nausea or vomiting. She denies any blood in her stool. She notes her urination is normal. She's not sure if she's ever had [facility] Fetch colonoscopy.   ED Course: Repeat labs, CXR, EKG.    Review of Systems: As per HPI otherwise 10 point review of systems negative.   Past Medical History:  Diagnosis Date  . Anemia   . Arthritis   . Asthma   . Diabetes mellitus    type 2  . Family history of adverse reaction to anesthesia    daughter has " Keller Bounds allergic reaction to anesthesia " does not know which one   . Gall stones   . Hypertension   . Kidney stones   . Thyroid disease     Past Surgical History:  Procedure Laterality Date  . ABDOMINAL HYSTERECTOMY    . CATARACT EXTRACTION Bilateral   . CHOLECYSTECTOMY    . HERNIA REPAIR     open     reports that she has never smoked. She has never used smokeless tobacco.  She reports that she does not drink alcohol or use drugs.  Allergies  Allergen Reactions  . Other     Some generic medication, can only take most brand name medications    Family History  Problem Relation Age of Onset  . Heart attack Mother   . Cancer Brother   . Diabetes Other     Prior to Admission medications   Medication Sig Start Date End Date Taking? Authorizing Provider  ibuprofen (ADVIL,MOTRIN) 200 MG tablet Take 400 mg by mouth daily as needed for mild pain.   Yes [provider]  Multiple Vitamins-Minerals (CENTRUM ADULTS PO) Take by mouth.   Yes [provider]  Multiple Vitamins-Minerals (ICAPS AREDS 2) CAPS Take by mouth.   Yes [provider]  OVER THE COUNTER MEDICATION Take 1 tablet by mouth daily. Diabetes Management Herbal Supplement   Yes [provider]  Tetrahydrozoline HCl (VISINE OP) Place 1-2 drops into both eyes daily as needed (watery eyes).   Yes [provider]    Physical Exam: Vitals:   04/20/17 1345 04/20/17 1551 04/20/17 1821 04/20/17 1957  BP: (!) 198/75 (!) 200/72 (!) 208/66 (!) 197/55  Pulse: 66 70 74 73  Resp: 16 14 16 18   Temp:  98.3 F (36.8 C) 98.9 F (37.2 C) 98.5 F (36.9 C)  TempSrc:  Oral Oral  Oral  SpO2: 99% 97% 100% 100%  Weight:    55.9 kg (123 lb 3.8 oz)  Height:    4\' 6"  (1.372 m)    Constitutional: NAD, calm, comfortable Vitals:   04/20/17 1345 04/20/17 1551 04/20/17 1821 04/20/17 1957  BP: (!) 198/75 (!) 200/72 (!) 208/66 (!) 197/55  Pulse: 66 70 74 73  Resp: 16 14 16 18   Temp:  98.3 F (36.8 C) 98.9 F (37.2 C) 98.5 F (36.9 C)  TempSrc:  Oral Oral Oral  SpO2: 99% 97% 100% 100%  Weight:    55.9 kg (123 lb 3.8 oz)  Height:    4\' 6"  (1.372 m)   Pleasant 83 yo female in NAD Eyes: PERRL, lids and conjunctivae normal ENMT: Mucous membranes are moist. Posterior pharynx clear of any exudate or lesions.Poor dentition.  Neck: normal, supple, no masses, no  thyromegaly Respiratory: clear to auscultation bilaterally, no wheezing, no crackles. Normal respiratory effort. No accessory muscle use.  Cardiovascular: Regular rate and rhythm, no murmurs / rubs / gallops. Trace LEE. 2+ pedal pulses.  Abdomen: no tenderness, no masses palpated. No hepatosplenomegaly. Bowel sounds positive.  Musculoskeletal: no clubbing / cyanosis. No joint deformity upper and lower extremities. Good ROM, no contractures. Normal muscle tone.  Skin: no rashes, lesions, ulcers. No induration Neurologic: CN 2-12 grossly intact. Sensation intact, DTR normal. Strength 5/5 in all 4.  Psychiatric: Normal judgment and insight. Alert and oriented x 3. Normal mood.   Labs on Admission: I have personally reviewed following labs and imaging studies  CBC:  Recent Labs Lab 04/19/17 1702 04/20/17 1237  WBC 6.9 7.0  NEUTROABS 4.5 4.4  HGB 6.4* 6.5*  HCT 19.6* 20.3*  MCV 85 89.8  PLT 473* 703*   Basic Metabolic Panel:  Recent Labs Lab 04/19/17 1702 04/20/17 1237  NA 140 139  K 5.3* 4.5  CL 103 106  CO2 22 22  GLUCOSE 109* 108*  BUN 44* 54*  CREATININE 3.27* 3.17*  CALCIUM 8.5* 8.7*   GFR: Estimated Creatinine Clearance: 7.6 mL/min (Quintin Hjort) (by C-G formula based on SCr of 3.17 mg/dL (H)). Liver Function Tests:  Recent Labs Lab 04/19/17 1702 04/20/17 1237  AST 32 30  ALT 14 17  ALKPHOS 70 63  BILITOT 0.3 0.6  PROT 6.7 7.0  ALBUMIN 4.1 4.0   No results for input(s): LIPASE, AMYLASE in the last 168 hours. No results for input(s): AMMONIA in the last 168 hours. Coagulation Profile: No results for input(s): INR, PROTIME in the last 168 hours. Cardiac Enzymes: No results for input(s): CKTOTAL, CKMB, CKMBINDEX, TROPONINI in the last 168 hours. BNP (last 3 results) No results for input(s): PROBNP in the last 8760 hours. HbA1C:  Recent Labs  04/19/17 1723  HGBA1C 6.9   CBG: No results for input(s): GLUCAP in the last 168 hours. Lipid Profile: No results for  input(s): CHOL, HDL, LDLCALC, TRIG, CHOLHDL, LDLDIRECT in the last 72 hours. Thyroid Function Tests:  Recent Labs  04/20/17 1730  TSH 38.346*   Anemia Panel: No results for input(s): VITAMINB12, FOLATE, FERRITIN, TIBC, IRON, RETICCTPCT in the last 72 hours. Urine analysis:    Component Value Date/Time   COLORURINE YELLOW 06/27/2014 0525   APPEARANCEUR CLOUDY (Talita Recht) 06/27/2014 0525   LABSPEC 1.011 06/27/2014 0525   PHURINE 6.0 06/27/2014 0525   GLUCOSEU NEGATIVE 06/27/2014 0525   HGBUR TRACE (Hobert Poplaski) 06/27/2014 0525   BILIRUBINUR NEGATIVE 06/27/2014 0525   KETONESUR NEGATIVE 06/27/2014 0525   PROTEINUR >300 (Sebastain Fishbaugh) 06/27/2014 0525  UROBILINOGEN 0.2 06/27/2014 0525   NITRITE NEGATIVE 06/27/2014 0525   LEUKOCYTESUR SMALL (Tifany Hirsch) 06/27/2014 0525    Radiological Exams on Admission: Dg Chest 2 View  Result Date: 04/20/2017 CLINICAL DATA:  Shortness of breath, anemia EXAM: CHEST  2 VIEW COMPARISON:  08/20/2016 FINDINGS: Cardiomegaly with vascular congestion. No confluent opacities or effusions. No overt edema. No acute bony abnormality. IMPRESSION: Cardiomegaly, vascular congestion. Electronically Signed   By: Rolm Baptise M.D.   On: 04/20/2017 14:17   Dg Chest 2 View  Result Date: 04/19/2017 CLINICAL DATA:  Worsening chronic dyspnea EXAM: CHEST  2 VIEW COMPARISON:  01/13/2011 chest radiograph. FINDINGS: Increased moderate cardiomegaly. Aortic atherosclerosis. Otherwise normal mediastinal contour. No pneumothorax. No pleural effusion. Cephalization of the pulmonary vasculature without overt pulmonary edema. No acute consolidative airspace disease. Small linear opacity in the right mid lung. IMPRESSION: 1. Increased moderate cardiomegaly. No overt pulmonary edema. No pleural effusions. 2. Small linear opacity in the right mid lung, which may represent minimal fluid in the minor fissure versus minimal scarring or atelectasis. Electronically Signed   By: Ilona Sorrel M.D.   On: 04/19/2017 17:29    EKG:  Independently reviewed. Appears similar to prior.  NSR, LAFB.  Late R wave transition.  Assessment/Plan Principal Problem:   Anemia Active Problems:   Hypothyroidism   Benign essential HTN   DM type 2 (diabetes mellitus, type 2) (HCC)   CKD (chronic kidney disease), stage V (HCC)   Anemia: normocytic.  Negative hemoccult.  No vaginal bleeding or blood in stool.  Will add on iron panel.  Maybe be related to her kidney disease.  Hypothyroidism contributing.  Transfuse 2 units pRBC  [ ]  post transfusion H/H Iron panel, B12, folate  Further w/u for anemia prn (would f/u hx of colonoscopy, she's unsure of this)  CKD: unclear baseline, last Cr 1.36 in 2015.  Currently 3.17, CrCl ~10.  Has history of proteinuria.  Likely related to longstanding HTN and DM as well.  Has trace edema, but doesn't appear grossly overloaded.  CXR did have cardiomegaly, but no overt edema.  F/u UA, UP/C I/O's Renal US Daily BMP (f/u Cr after transfusion) Renal consult  Renally dose medications  Dyspnea on Exertion: Likely related to above with her anemia and CKD, but notably with PND and orthopnea as well so will f/u echo.  Elevated proBNP, but in setting of CKD.  Doesn't appear grossly volume overloaded.  Hold off on lasix for now.  No CP, initial trop negative.  [ ]  echo   Hypothyroidism:  Start synthroid 50 mcg daily (if cardiac dz on echo may need to decrease to 25) TSH/T4 pending  HTN: significantly elevated to 235T systolic, but asx.   Will start amlodipine and carvedilol (she's concerned about all generic meds, has had problems in the past before with these - per PCP note, looks like this was with glucotrol in the past) Hydral 5 IV q 6 prn BP >180/110   T2DM: A1c 6.9.  SSI ordered.  F/u outpatient.   Thrombocytosis: ctm  DVT prophylaxis: SCD  Code Status: DNR  Family Communication: grandaughter in room  Disposition Plan: floor, telemetry  Consults called: nephrology Admission status: telemetry     Fayrene Helper MD [facility] [phone]  If 7PM-7AM, please contact night-coverage [email or web address] Password [email or web address]  04/20/2017, 8:45 PM

## 2017-04-20 NOTE — Progress Notes (Signed)
Call report to [facility] [phone] @ 1655

## 2017-04-20 NOTE — Telephone Encounter (Signed)
Granddaughter called concerning her grandmother lab results.  I advised her to take her to the [facility] or [facility] ED now because the Hemoglobin is 6.4. The reference range for hemoglobin is 11.1-15.9 and she needs to go to either ED. The granddaughter stated she her grandmother does not go to [facility] or [facility] because she was almost killed the last time she was there. She will go to [facility], I advised her we do not have any medical relations with [facility]. No medical information will be released to [place] concerning the patient if she goes there.

## 2017-04-21 ENCOUNTER — Observation Stay (HOSPITAL_BASED_OUTPATIENT_CLINIC_OR_DEPARTMENT_OTHER): Payer: Medicare Other

## 2017-04-21 ENCOUNTER — Observation Stay (HOSPITAL_COMMUNITY): Payer: Medicare Other

## 2017-04-21 DIAGNOSIS — D649 Anemia, unspecified: Secondary | ICD-10-CM

## 2017-04-21 DIAGNOSIS — I10 Essential (primary) hypertension: Secondary | ICD-10-CM | POA: Diagnosis not present

## 2017-04-21 DIAGNOSIS — I340 Nonrheumatic mitral (valve) insufficiency: Secondary | ICD-10-CM | POA: Diagnosis not present

## 2017-04-21 DIAGNOSIS — E039 Hypothyroidism, unspecified: Secondary | ICD-10-CM

## 2017-04-21 DIAGNOSIS — N185 Chronic kidney disease, stage 5: Secondary | ICD-10-CM

## 2017-04-21 DIAGNOSIS — E118 Type 2 diabetes mellitus with unspecified complications: Secondary | ICD-10-CM | POA: Diagnosis not present

## 2017-04-21 LAB — PROTEIN / CREATININE RATIO, URINE
CREATININE, URINE: 52.71 mg/dL
Protein Creatinine Ratio: 6.72 mg/mg{Cre} — ABNORMAL HIGH (ref 0.00–0.15)
TOTAL PROTEIN, URINE: 354 mg/dL

## 2017-04-21 LAB — GLUCOSE, CAPILLARY
GLUCOSE-CAPILLARY: 126 mg/dL — AB (ref 65–99)
Glucose-Capillary: 125 mg/dL — ABNORMAL HIGH (ref 65–99)

## 2017-04-21 LAB — IRON AND TIBC
IRON: 107 ug/dL (ref 28–170)
IRON: 82 ug/dL (ref 28–170)
SATURATION RATIOS: 39 % — AB (ref 10.4–31.8)
SATURATION RATIOS: 30 % (ref 10.4–31.8)
TIBC: 273 ug/dL (ref 250–450)
TIBC: 274 ug/dL (ref 250–450)
UIBC: 166 ug/dL
UIBC: 192 ug/dL

## 2017-04-21 LAB — TROPONIN I: Troponin I: <0.03 ng/mL (ref <0.03)

## 2017-04-21 LAB — VITAMIN B12
VITAMIN B 12: 647 pg/mL (ref 180–914)
Vitamin B-12: 678 pg/mL (ref 180–914)

## 2017-04-21 LAB — CBC
HCT: 29.7 % — ABNORMAL LOW (ref 36.0–46.0)
Hemoglobin: 9.8 g/dL — ABNORMAL LOW (ref 12.0–15.0)
MCH: 28.5 pg (ref 26.0–34.0)
MCHC: 33 g/dL (ref 30.0–36.0)
MCV: 86.3 fL (ref 78.0–100.0)
PLATELETS: 476 10*3/uL — AB (ref 150–400)
RBC: 3.44 MIL/uL — AB (ref 3.87–5.11)
RDW: 16.8 % — ABNORMAL HIGH (ref 11.5–15.5)
WBC: 8.7 10*3/uL (ref 4.0–10.5)

## 2017-04-21 LAB — BASIC METABOLIC PANEL
ANION GAP: 11 (ref 5–15)
BUN: 54 mg/dL — ABNORMAL HIGH (ref 6–20)
CO2: 21 mmol/L — ABNORMAL LOW (ref 22–32)
Calcium: 8.7 mg/dL — ABNORMAL LOW (ref 8.9–10.3)
Chloride: 107 mmol/L (ref 101–111)
Creatinine, Ser: 2.97 mg/dL — ABNORMAL HIGH (ref 0.44–1.00)
GFR calc Af Amer: 15 mL/min — ABNORMAL LOW (ref >60)
GFR, EST NON AFRICAN AMERICAN: 13 mL/min — AB (ref >60)
GLUCOSE: 106 mg/dL — AB (ref 65–99)
POTASSIUM: 4.2 mmol/L (ref 3.5–5.1)
Sodium: 139 mmol/L (ref 135–145)

## 2017-04-21 LAB — FERRITIN
Ferritin: 112 ng/mL (ref 11–307)
Ferritin: 117 ng/mL (ref 11–307)

## 2017-04-21 LAB — ECHOCARDIOGRAM COMPLETE
HEIGHTINCHES: 54 in
Weight: 1971.79 oz

## 2017-04-21 LAB — RETICULOCYTES
RBC.: 3.44 MIL/uL — ABNORMAL LOW (ref 3.87–5.11)
RETIC CT PCT: 1.6 % (ref 0.4–3.1)
Retic Count, Absolute: 55 10*3/uL (ref 19.0–186.0)

## 2017-04-21 LAB — FOLATE
FOLATE: 65.7 ng/mL (ref >5.9)
Folate: 66.6 ng/mL (ref >5.9)

## 2017-04-21 MED ORDER — FUROSEMIDE 10 MG/ML IJ SOLN
40.0000 mg | Freq: Every day | INTRAMUSCULAR | Status: DC
  Administered 2017-04-21: 40 mg via INTRAVENOUS
  Filled 2017-04-21: qty 4

## 2017-04-21 MED ORDER — HYDRALAZINE HCL 20 MG/ML IJ SOLN
10.0000 mg | Freq: Four times a day (QID) | INTRAMUSCULAR | Status: DC | PRN
  Administered 2017-04-21 – 2017-04-22 (×2): 10 mg via INTRAVENOUS
  Filled 2017-04-21 (×2): qty 1

## 2017-04-21 MED ORDER — LORAZEPAM 0.5 MG PO TABS
0.2500 mg | ORAL_TABLET | Freq: Once | ORAL | Status: AC
  Administered 2017-04-21: 0.25 mg via ORAL
  Filled 2017-04-21: qty 1

## 2017-04-21 MED ORDER — ACETAMINOPHEN 500 MG PO TABS
500.0000 mg | ORAL_TABLET | Freq: Four times a day (QID) | ORAL | Status: DC | PRN
  Administered 2017-04-21: 500 mg via ORAL
  Filled 2017-04-21: qty 1

## 2017-04-21 NOTE — Progress Notes (Addendum)
Pt and family asking for pt to be discharged since echo and [place] of kidneys as well as blood transfusions are complete.  Notified MD who did not agree to discharge at this time.  Pt advised and got upset.  Pt decided to leave AMA.  Educated pt and family of consequences/ possible adverse effects.  Pt and family decided to leave anyway.  Pt refused to sign AMA paper.  IV and tele removed.     Pt now refusing to leave.  Still refusing treatment.  MD made aware (Dr. Karleen Hampshire) and says let her stay and calm down and if we need an IV we can try later.  Still no discharge order.  Will continue with plan of care.

## 2017-04-21 NOTE — Progress Notes (Signed)
PROGRESS NOTE    Janet [name]  ZTI:[id number] DOB: [birth year] DOA: 04/20/2017 PCP: Rutherford Guys, MD    Brief Narrative:  Janet [name] is a 83 y.o. female with medical history significant of diabetes, high blood pressure, anemia, gallstones, hypothyroidism presenting with shortness of breath on exertion and abnormal labs Assessment & Plan:   Principal Problem:   Anemia Active Problems:   Hypothyroidism   Benign essential HTN   DM type 2 (diabetes mellitus, type 2) (HCC)   CKD (chronic kidney disease), stage V (HCC)    SOB:  Possibly from vascular congestion.  Get echocardiogram to evaluate for CHF.  Differential include from CKD. One dose of IV lasix ordered.  Pt denies chest pain or orthopnea, but she has pedal edema. No signs of pneumonia or fever.    Acute on CKD STAGE 4 to 5. : Pt last seen a physician in 2013, btu we have records will stage 3 CKD from 2015.  Worsening of creatinine this admission.  Renal ultrasound shows medical renal disease.  Improving creatinine.  Will get renal consult in am.   Anemia of chronic disease from CKD:  Iron studies show adequate iron and ferritin levels.  Transfused 2 units of prbc last night and repeat H&h is 9.8.  Vit b12 and folate levels are also adequate.  Stool for occult blood is negative.    Hypothyroidism;  Resume synthroid.    Hypertension: sub optimal, restart coreg and amlodipine.      DVT prophylaxis:  Code Status: DNR.  Family Communication: daughter at bedside.  Disposition Plan: pending further evaluation.   Consultants:   None.   Procedures:US renal.    Antimicrobials: none.    Subjective: Sob improved.   Objective: Vitals:   04/21/17 0130 04/21/17 0207 04/21/17 0410 04/21/17 0445  BP: (!) 189/67 (!) 185/73 (!) 203/70 (!) 172/59  Pulse: 67 70  82  Resp: 18 20  20   Temp: 97.7 F (36.5 C) 98 F (36.7 C)  97.8 F (36.6 C)  TempSrc: Oral Oral  Oral  SpO2: 99% 96%  97%  Weight:       Height:        Intake/Output Summary (Last 24 hours) at 04/21/17 1408 Last data filed at 04/21/17 1000  Gross per 24 hour  Intake             1282 ml  Output             1400 ml  Net             -118 ml   Filed Weights   04/20/17 1957  Weight: 55.9 kg (123 lb 3.8 oz)    Examination:  General exam: Appears calm and comfortable  Respiratory system: Clear to auscultation. Respiratory effort normal. Cardiovascular system: S1 & S2 heard, RRR. No JVD, murmurs, rubs, gallops or clicks. No pedal edema. Gastrointestinal system: Abdomen is nondistended, soft and nontender. No organomegaly or masses felt. Normal bowel sounds heard. Central nervous system: Alert and oriented. No focal neurological deficits. Extremities: Symmetric 5 x 5 power. Skin: No rashes, lesions or ulcers Psychiatry: Judgement and insight appear normal. Mood & affect appropriate.     Data Reviewed: I have personally reviewed following labs and imaging studies  CBC:  Recent Labs Lab 04/19/17 1702 04/20/17 1237 04/21/17 1004  WBC 6.9 7.0 8.7  NEUTROABS 4.5 4.4  --   HGB 6.4* 6.5* 9.8*  HCT 19.6* 20.3* 29.7*  MCV 85 89.8 86.3  PLT 473*  480* 710*   Basic Metabolic Panel:  Recent Labs Lab 04/19/17 1702 04/20/17 1237 04/21/17 1004  NA 140 139 139  K 5.3* 4.5 4.2  CL 103 106 107  CO2 22 22 21*  GLUCOSE 109* 108* 106*  BUN 44* 54* 54*  CREATININE 3.27* 3.17* 2.97*  CALCIUM 8.5* 8.7* 8.7*   GFR: Estimated Creatinine Clearance: 8.1 mL/min (A) (by C-G formula based on SCr of 2.97 mg/dL (H)). Liver Function Tests:  Recent Labs Lab 04/19/17 1702 04/20/17 1237  AST 32 30  ALT 14 17  ALKPHOS 70 63  BILITOT 0.3 0.6  PROT 6.7 7.0  ALBUMIN 4.1 4.0   No results for input(s): LIPASE, AMYLASE in the last 168 hours. No results for input(s): AMMONIA in the last 168 hours. Coagulation Profile: No results for input(s): INR, PROTIME in the last 168 hours. Cardiac Enzymes:  Recent Labs Lab  04/21/17 1004  TROPONINI <0.03   BNP (last 3 results) No results for input(s): PROBNP in the last 8760 hours. HbA1C:  Recent Labs  04/19/17 1723  HGBA1C 6.9   CBG:  Recent Labs Lab 04/20/17 2113 04/21/17 0720 04/21/17 1234  GLUCAP 172* 126* 125*   Lipid Profile: No results for input(s): CHOL, HDL, LDLCALC, TRIG, CHOLHDL, LDLDIRECT in the last 72 hours. Thyroid Function Tests:  Recent Labs  04/20/17 1730  TSH 38.346*  FREET4 0.68   Anemia Panel:  Recent Labs  04/20/17 1721 04/21/17 1004  VITAMINB12  --  678  FOLATE 66.6  --   FERRITIN  --  112  TIBC  --  273  IRON  --  107  RETICCTPCT  --  1.6   Sepsis Labs: No results for input(s): PROCALCITON, LATICACIDVEN in the last 168 hours.  No results found for this or any previous visit (from the past 240 hour(s)).       Radiology Studies: Dg Chest 2 View  Result Date: 04/20/2017 CLINICAL DATA:  Shortness of breath, anemia EXAM: CHEST  2 VIEW COMPARISON:  08/20/2016 FINDINGS: Cardiomegaly with vascular congestion. No confluent opacities or effusions. No overt edema. No acute bony abnormality. IMPRESSION: Cardiomegaly, vascular congestion. Electronically Signed   By: Rolm Baptise M.D.   On: 04/20/2017 14:17   Dg Chest 2 View  Result Date: 04/19/2017 CLINICAL DATA:  Worsening chronic dyspnea EXAM: CHEST  2 VIEW COMPARISON:  01/13/2011 chest radiograph. FINDINGS: Increased moderate cardiomegaly. Aortic atherosclerosis. Otherwise normal mediastinal contour. No pneumothorax. No pleural effusion. Cephalization of the pulmonary vasculature without overt pulmonary edema. No acute consolidative airspace disease. Small linear opacity in the right mid lung. IMPRESSION: 1. Increased moderate cardiomegaly. No overt pulmonary edema. No pleural effusions. 2. Small linear opacity in the right mid lung, which may represent minimal fluid in the minor fissure versus minimal scarring or atelectasis. Electronically Signed   By: Ilona Sorrel M.D.   On: 04/19/2017 17:29   US Renal  Result Date: 04/21/2017 CLINICAL DATA:  83 year old hypertensive diabetic female with chronic kidney disease. Initial encounter. EXAM: RENAL / URINARY TRACT ULTRASOUND COMPLETE COMPARISON:  06/27/2014 CT. FINDINGS: Right Kidney: Length: 8 cm. Increased echogenicity renal parenchyma. No hydronephrosis. 8 mm and 1.9 cm cyst Left Kidney: Length: 8.5 cm. Increased echogenicity renal parenchyma. No hydronephrosis or mass. Bladder: Appears normal for degree of bladder distention. IMPRESSION: Increased echogenicity of renal parenchyma consistent with changes of medical renal disease. No hydronephrosis. Right renal cysts. Electronically Signed   By: Genia Del M.D.   On: 04/21/2017 12:31  Scheduled Meds: . amLODipine  5 mg Oral Daily  . carvedilol  3.125 mg Oral BID WC  . feeding supplement (ENSURE ENLIVE)  237 mL Oral BID BM  . furosemide  40 mg Intravenous Daily  . insulin aspart  0-9 Units Subcutaneous TID WC  . levothyroxine  50 mcg Oral QAC breakfast   Continuous Infusions: . sodium chloride       LOS: 0 days    Time spent: 35 minutes    Shriley Joffe, MD [facility] Pager [phone]  If 7PM-7AM, please contact night-coverage [email or web address] Password [email or web address] 04/21/2017, 2:08 PM

## 2017-04-21 NOTE — Progress Notes (Signed)
During blood transfusion pt complains of new onset of chest pressure and headache. BP elevated at 188/55 with minimal relief from PRN hydralazine. On call NP, Baltazar Najjar made aware. EKG obtained and additional one time dose of 5mg  hydralazine administered. Pt's follow up BP 177/58. Will continue to monitor closely.

## 2017-04-21 NOTE — Addendum Note (Signed)
Addended by: Rutherford Guys on: 04/21/2017 01:40 PM   Modules accepted: Orders

## 2017-04-21 NOTE — Progress Notes (Signed)
  Echocardiogram 2D Echocardiogram has been performed.  Timmya Blazier L Androw 04/21/2017, 3:51 PM

## 2017-04-21 NOTE — Evaluation (Signed)
Physical Therapy Evaluation Patient Details Name: Janet [name] MRN: [id number] DOB: Jun 21, 1924 Today's Date: 04/21/2017   History of Present Illness  83 yo female admitted with anemia. hx of Dm, HTN, anemia, CKD, hypothyroidism, asthma  Clinical Impression  On eval, pt was Min guard assist for mobility. She walked ~150 feet with a straight cane. Pt c/o mild lightheadedness, mild dyspnea, and some pain in R ankle with ambulation. O2 sat 100% on Ra, HR 75 bpm. Granddaughter present during session. She stated she checks on pt often. Recommend HHPT f/u.     Follow Up Recommendations Home health PT;Supervision - Intermittent    Equipment Recommendations  None recommended by PT    Recommendations for Other Services       Precautions / Restrictions Precautions Precautions: Fall Restrictions Weight Bearing Restrictions: No      Mobility  Bed Mobility Overal bed mobility: Modified Independent                Transfers Overall transfer level: Needs assistance Equipment used: None;Straight cane Transfers: Sit to/from Stand Sit to Stand: Min guard         General transfer comment: close guard for safety.   Ambulation/Gait Ambulation/Gait assistance: Min guard Ambulation Distance (Feet): 150 Feet Assistive device: Straight cane Gait Pattern/deviations: Step-through pattern;Decreased stride length;Drifts right/left     General Gait Details: close guard for safety. Pt c/o some R ankle pain towards end of distance. 2 brief standing rest breaks. Dyspnea 2/4.   Stairs            Wheelchair Mobility    Modified Rankin (Stroke Patients Only)       Balance                                             Pertinent Vitals/Pain Pain Assessment: Faces Faces Pain Scale: Hurts little more Pain Location: R ankle Pain Descriptors / Indicators: Sore;Aching Pain Intervention(s): Monitored during session    Home Living Family/patient expects to be  discharged to:: Private residence Living Arrangements: Alone Available Help at Discharge: Family;Available PRN/intermittently Type of Home: Apartment Home Access: Stairs to enter Entrance Stairs-Rails: None Entrance Stairs-Number of Steps: 1 Home Layout: One level Home Equipment: Cane - single point      Prior Function Level of Independence: Independent with assistive device(s)               Hand Dominance        Extremity/Trunk Assessment   Upper Extremity Assessment Upper Extremity Assessment: Overall WFL for tasks assessed    Lower Extremity Assessment Lower Extremity Assessment: Generalized weakness    Cervical / Trunk Assessment Cervical / Trunk Assessment: Normal  Communication   Communication: No difficulties  Cognition Arousal/Alertness: Awake/alert Behavior During Therapy: WFL for tasks assessed/performed Overall Cognitive Status: Within Functional Limits for tasks assessed                                        General Comments      Exercises     Assessment/Plan    PT Assessment Patient needs continued PT services  PT Problem List Decreased mobility;Decreased activity tolerance;Decreased balance;Decreased knowledge of use of DME;Pain       PT Treatment Interventions DME instruction;Gait training;Therapeutic activities;Therapeutic exercise;Patient/family education;Functional mobility training;Balance training  PT Goals (Current goals can be found in the Care Plan section)  Acute Rehab PT Goals Patient Stated Goal: home PT Goal Formulation: With patient/family Time For Goal Achievement: 05/05/17 Potential to Achieve Goals: Good    Frequency Min 3X/week   Barriers to discharge        Co-evaluation               AM-PAC PT "6 Clicks" Daily Activity  Outcome Measure Difficulty turning over in bed (including adjusting bedclothes, sheets and blankets)?: None Difficulty moving from lying on back to sitting on the  side of the bed? : None Difficulty sitting down on and standing up from a chair with arms (e.g., wheelchair, bedside commode, etc,.)?: None Help needed moving to and from a bed to chair (including a wheelchair)?: None Help needed walking in hospital room?: A Little Help needed climbing 3-5 steps with a railing? : A Little 6 Click Score: 22    End of Session Equipment Utilized During Treatment: Gait belt Activity Tolerance: Patient tolerated treatment well Patient left: in bed;with call bell/phone within reach;with family/visitor present   PT Visit Diagnosis: Muscle weakness (generalized) (M62.81);Difficulty in walking, not elsewhere classified (R26.2);Pain Pain - Right/Left: Right Pain - part of body: Ankle and joints of foot    Time: 5027-7412 PT Time Calculation (min) (ACUTE ONLY): 14 min   Charges:   PT Evaluation $PT Eval Low Complexity: 1 Low     PT G Codes:   PT G-Codes **NOT FOR INPATIENT CLASS** Functional Assessment Tool Used: AM-PAC 6 Clicks Basic Mobility;Clinical judgement Functional Limitation: Mobility: Walking and moving around Mobility: Walking and Moving Around Current Status ([id number]): At least 1 percent but less than 20 percent impaired, limited or restricted Mobility: Walking and Moving Around Goal Status [phone]): At least 1 percent but less than 20 percent impaired, limited or restricted      Weston Anna, MPT Pager: [phone]

## 2017-04-21 NOTE — Care Management Note (Signed)
Case Management Note  Patient Details  Name: Janet [name] MRN: [id number] Date of Birth: 1924/07/03  Subjective/Objective: 83 y/o f admitted w/Anemia. From home alone. Spanish speaking, but speaks [place] well.Has dtr support.DNR. PT cons-await recc.                  Action/Plan:d/c plan home.   Expected Discharge Date:   (unknown)               Expected Discharge Plan:  Home/Self Care  In-House Referral:     Discharge planning Services  CM Consult  Post Acute Care Choice:    Choice offered to:     DME Arranged:    DME Agency:     HH Arranged:    HH Agency:     Status of Service:  In process, will continue to follow  If discussed at Long Length of Stay Meetings, dates discussed:    Additional Comments:  Dessa Phi, RN 04/21/2017, 10:46 AM

## 2017-04-21 NOTE — Telephone Encounter (Signed)
Jennifer(pt's granddaughter) would like Dr. Pamella Pert to know-pt is at [facility] room 1404. Her hemoglobin was 6.5 and she got a blood transfusion, she is getting an ultrasound of her heart and kidneys. She would like to know if she can go home after this. She doesn't feel weak, she feels fine. Please advise at [phone]

## 2017-04-21 NOTE — Progress Notes (Signed)
Chaplain following due to spiritual care consult re: advance directive.   Spoke with pt at bedside, who is alert and oriented.  Granddaughter present at bedside.  Provided education around health care power of attorney and living will.    Pt wishes to complete both, stating she has a DNR in place, but expresses clear wishes not to be placed on life support.    She is going to work on paperwork with her grand daughter and page chaplain when ready to notarize.    Please page chaplain as other questions arise.     [facility] / [place], MDiv

## 2017-04-22 DIAGNOSIS — N185 Chronic kidney disease, stage 5: Secondary | ICD-10-CM | POA: Diagnosis not present

## 2017-04-22 DIAGNOSIS — E039 Hypothyroidism, unspecified: Secondary | ICD-10-CM | POA: Diagnosis not present

## 2017-04-22 DIAGNOSIS — D649 Anemia, unspecified: Secondary | ICD-10-CM | POA: Diagnosis not present

## 2017-04-22 DIAGNOSIS — I10 Essential (primary) hypertension: Secondary | ICD-10-CM | POA: Diagnosis not present

## 2017-04-22 LAB — BPAM RBC
BLOOD PRODUCT EXPIRATION DATE: 201810272359
Blood Product Expiration Date: 201810272359
ISSUE DATE / TIME: 201810102228
ISSUE DATE / TIME: 201810110137
UNIT TYPE AND RH: 5100
Unit Type and Rh: 5100

## 2017-04-22 LAB — CBC
HCT: 30.1 % — ABNORMAL LOW (ref 36.0–46.0)
Hemoglobin: 9.9 g/dL — ABNORMAL LOW (ref 12.0–15.0)
MCH: 28.9 pg (ref 26.0–34.0)
MCHC: 32.9 g/dL (ref 30.0–36.0)
MCV: 87.8 fL (ref 78.0–100.0)
PLATELETS: 504 10*3/uL — AB (ref 150–400)
RBC: 3.43 MIL/uL — ABNORMAL LOW (ref 3.87–5.11)
RDW: 16.9 % — AB (ref 11.5–15.5)
WBC: 8 10*3/uL (ref 4.0–10.5)

## 2017-04-22 LAB — BASIC METABOLIC PANEL
ANION GAP: 15 (ref 5–15)
BUN: 67 mg/dL — AB (ref 6–20)
CALCIUM: 8.6 mg/dL — AB (ref 8.9–10.3)
CO2: 21 mmol/L — ABNORMAL LOW (ref 22–32)
Chloride: 104 mmol/L (ref 101–111)
Creatinine, Ser: 3.06 mg/dL — ABNORMAL HIGH (ref 0.44–1.00)
GFR calc Af Amer: 14 mL/min — ABNORMAL LOW (ref >60)
GFR, EST NON AFRICAN AMERICAN: 12 mL/min — AB (ref >60)
GLUCOSE: 135 mg/dL — AB (ref 65–99)
Potassium: 4.2 mmol/L (ref 3.5–5.1)
SODIUM: 140 mmol/L (ref 135–145)

## 2017-04-22 LAB — TYPE AND SCREEN
ABO/RH(D): O POS
Antibody Screen: NEGATIVE
UNIT DIVISION: 0
UNIT DIVISION: 0

## 2017-04-22 LAB — GLUCOSE, CAPILLARY
GLUCOSE-CAPILLARY: 168 mg/dL — AB (ref 65–99)
GLUCOSE-CAPILLARY: 175 mg/dL — AB (ref 65–99)
Glucose-Capillary: 135 mg/dL — ABNORMAL HIGH (ref 65–99)

## 2017-04-22 MED ORDER — LEVOTHYROXINE SODIUM 50 MCG PO TABS: 50.0000 ug | ORAL_TABLET | Freq: Every day | ORAL | 0 refills | Status: DC

## 2017-04-22 MED ORDER — CARVEDILOL 3.125 MG PO TABS: 3.1250 mg | ORAL_TABLET | Freq: Two times a day (BID) | ORAL | 0 refills | Status: DC

## 2017-04-22 MED ORDER — AMLODIPINE BESYLATE 5 MG PO TABS: 5.0000 mg | ORAL_TABLET | Freq: Every day | ORAL | 0 refills | Status: DC

## 2017-04-22 NOTE — Progress Notes (Signed)
CM went into rm w/[facility] rep Santiago Glad to confirm HHPT services-patient agreed.

## 2017-04-22 NOTE — Care Management Note (Signed)
Case Management Note  Patient Details  Name: Janet [name] MRN: [id number] Date of Birth: Sep 13, 1923  Subjective/Objective:  PT recc HHPT. [name].Utilized interpreting services for choice of HHC agency-Patient chose [name] aware of HHPT,& d/c. No further CM needs.                  Action/Plan:d/c home w/HHC.   Expected Discharge Date:  04/22/17               Expected Discharge Plan:  [place]  In-House Referral:  Interpreting Services  Discharge planning Services  CM Consult  Post Acute Care Choice:    Choice offered to:     DME Arranged:    DME Agency:     HH Arranged:  PT [place]:  [place]  Status of Service:  Completed, signed off  If discussed at [place] of Stay Meetings, dates discussed:    Additional Comments:  Dessa Phi, RN 04/22/2017, 11:33 AM

## 2017-04-22 NOTE — Progress Notes (Addendum)
Pt does not have VA insurance, pt is [facility].

## 2017-04-25 NOTE — Discharge Summary (Signed)
Physician Discharge Summary  Janet [name] KVQ:[id number] DOB: 16-Sep-1923 DOA: 04/20/2017  PCP: Rutherford Guys, MD  Admit date: 04/20/2017 Discharge date: 04/22/2017  Admitted From: Home.  Disposition:  Home.   Recommendations for Outpatient Follow-up:  1. Follow up with PCP in 1-2 weeks 2. Please obtain BMP/CBC in one week 3. Please follow up with nephrologist as soon as possible.   Home Health:yes   Discharge Condition: stable.  CODE STATUS: full code.  Diet recommendation: Heart Healthy   Brief/Interim Summary: Janet [name] a 83 y.o.femalewith medical history significant of diabetes, high blood pressure, anemia, gallstones, hypothyroidism presenting with shortness of breath on exertion and abnormal labs  Discharge Diagnoses:  Principal Problem:   Anemia Active Problems:   Hypothyroidism   Benign essential HTN   DM type 2 (diabetes mellitus, type 2) (HCC)   CKD (chronic kidney disease), stage V (HCC)   SOB:  Possibly from vascular congestion.  Echocardiogram shows  The   estimated ejection fraction was in the range of 60% to 65%. Wall   motion was normal; there were no regional wall motion   abnormalities. Doppler parameters are consistent with abnormal   left ventricular relaxation (grade 1 diastolic dysfunction). Differential include from CKD. One dose of IV lasix ordered. . Pt refused to take any  More doses .  Pt denies chest pain or orthopnea, No signs of pneumonia or fever.    Acute on CKD STAGE 4 to 5. : Pt last seen a physician in 2013, but  we have records will stage 3 CKD from 2015.  Worsening of creatinine this admission.  Renal ultrasound shows medical renal disease.  Plan was to get renal consult, but pt refused to stay and wanted to follow upwith renal as outpatient.   Anemia of chronic disease from CKD:  Iron studies show adequate iron and ferritin levels.  Transfused 2 units of prbc last night and repeat H&h is 9.8.  Vit b12 and  folate levels are also adequate.  Stool for occult blood is negative.    Hypothyroidism;  Resume synthroid.    Hypertension: sub optimal, restart coreg and amlodipine.      Discharge Instructions  Discharge Instructions    Diet - low sodium heart healthy    Complete by:  As directed    Discharge instructions    Complete by:  As directed    Follow up with renal in 1 weeks as recommended.  Please follow up with PCP in 1 to 2 days and get referral to nephrologist     Allergies as of 04/22/2017      Reactions   Other    Some generic medication, can only take most brand name medications      Medication List    STOP taking these medications   ibuprofen 200 MG tablet Commonly known as:  ADVIL,MOTRIN     TAKE these medications   amLODipine 5 MG tablet Commonly known as:  NORVASC Take 1 tablet (5 mg total) by mouth daily.   carvedilol 3.125 MG tablet Commonly known as:  COREG Take 1 tablet (3.125 mg total) by mouth 2 (two) times daily with a meal.   CENTRUM ADULTS PO Take by mouth. What changed:  Another medication with the same name was removed. Continue taking this medication, and follow the directions you see here.   levothyroxine 50 MCG tablet Commonly known as:  SYNTHROID, LEVOTHROID Take 1 tablet (50 mcg total) by mouth daily before breakfast.   OVER THE COUNTER  MEDICATION Take 1 tablet by mouth daily. Diabetes Management Herbal Supplement   VISINE OP Place 1-2 drops into both eyes daily as needed (watery eyes).      Follow-up Information    Rutherford Guys, MD. Schedule an appointment as soon as possible for a visit in 1 week(s).   Specialty:  Family Medicine Contact information: [address] [place] [address] [phone]        [place], [place]. Schedule an appointment as soon as possible for a visit in 1 week(s).   Why:  for stage 4 CKD.  Contact information: [place] [address] [phone]        Health,  [facility]-Home Follow up.   Why:  [facility]-spanish speaking. Contact information: [place] [address] [phone]          Allergies  Allergen Reactions  . Other     Some generic medication, can only take most brand name medications    Consultations: None.  Procedures/Studies: Dg Chest 2 View  Result Date: 04/20/2017 CLINICAL DATA:  Shortness of breath, anemia EXAM: CHEST  2 VIEW COMPARISON:  08/20/2016 FINDINGS: Cardiomegaly with vascular congestion. No confluent opacities or effusions. No overt edema. No acute bony abnormality. IMPRESSION: Cardiomegaly, vascular congestion. Electronically Signed   By: Rolm Baptise M.D.   On: 04/20/2017 14:17   Dg Chest 2 View  Result Date: 04/19/2017 CLINICAL DATA:  Worsening chronic dyspnea EXAM: CHEST  2 VIEW COMPARISON:  01/13/2011 chest radiograph. FINDINGS: Increased moderate cardiomegaly. Aortic atherosclerosis. Otherwise normal mediastinal contour. No pneumothorax. No pleural effusion. Cephalization of the pulmonary vasculature without overt pulmonary edema. No acute consolidative airspace disease. Small linear opacity in the right mid lung. IMPRESSION: 1. Increased moderate cardiomegaly. No overt pulmonary edema. No pleural effusions. 2. Small linear opacity in the right mid lung, which may represent minimal fluid in the minor fissure versus minimal scarring or atelectasis. Electronically Signed   By: Ilona Sorrel M.D.   On: 04/19/2017 17:29   US Renal  Result Date: 04/21/2017 CLINICAL DATA:  83 year old hypertensive diabetic female with chronic kidney disease. Initial encounter. EXAM: RENAL / URINARY TRACT ULTRASOUND COMPLETE COMPARISON:  06/27/2014 CT. FINDINGS: Right Kidney: Length: 8 cm. Increased echogenicity renal parenchyma. No hydronephrosis. 8 mm and 1.9 cm cyst Left Kidney: Length: 8.5 cm. Increased echogenicity renal parenchyma. No hydronephrosis or mass. Bladder: Appears normal for  degree of bladder distention. IMPRESSION: Increased echogenicity of renal parenchyma consistent with changes of medical renal disease. No hydronephrosis. Right renal cysts. Electronically Signed   By: Genia Del M.D.   On: 04/21/2017 12:31       Subjective: No new complaints.   Discharge Exam: Vitals:   04/22/17 0439 04/22/17 1500  BP: (!) 134/38 (!) 140/50  Pulse: 80 75  Resp: 19 18  Temp: 97.7 F (36.5 C) 98 F (36.7 C)  SpO2: 96% 98%   Vitals:   04/22/17 0137 04/22/17 0222 04/22/17 0439 04/22/17 1500  BP: (!) 206/67 (!) 156/49 (!) 134/38 (!) 140/50  Pulse:   80 75  Resp:   19 18  Temp:   97.7 F (36.5 C) 98 F (36.7 C)  TempSrc:   Oral Oral  SpO2:   96% 98%  Weight:      Height:        General: Pt is alert, awake, not in acute distress Cardiovascular: RRR, S1/S2 +, no rubs, no gallops Respiratory: CTA bilaterally, no wheezing, no rhonchi Abdominal: Soft,  NT, ND, bowel sounds + Extremities: no edema, no cyanosis    The results of significant diagnostics from this hospitalization (including imaging, microbiology, ancillary and laboratory) are listed below for reference.     Microbiology: No results found for this or any previous visit (from the past 240 hour(s)).   Labs: BNP (last 3 results)  Recent Labs  04/19/17 1702 04/20/17 1234  BNP 401.3* 286.3*   Basic Metabolic Panel:  Recent Labs Lab 04/19/17 1702 04/20/17 1237 04/21/17 1004 04/22/17 0446  NA 140 139 139 140  K 5.3* 4.5 4.2 4.2  CL 103 106 107 104  CO2 22 22 21* 21*  GLUCOSE 109* 108* 106* 135*  BUN 44* 54* 54* 67*  CREATININE 3.27* 3.17* 2.97* 3.06*  CALCIUM 8.5* 8.7* 8.7* 8.6*   Liver Function Tests:  Recent Labs Lab 04/19/17 1702 04/20/17 1237  AST 32 30  ALT 14 17  ALKPHOS 70 63  BILITOT 0.3 0.6  PROT 6.7 7.0  ALBUMIN 4.1 4.0   No results for input(s): LIPASE, AMYLASE in the last 168 hours. No results for input(s): AMMONIA in the last 168 hours. CBC:  Recent  Labs Lab 04/19/17 1702 04/20/17 1237 04/21/17 1004 04/22/17 0446  WBC 6.9 7.0 8.7 8.0  NEUTROABS 4.5 4.4  --   --   HGB 6.4* 6.5* 9.8* 9.9*  HCT 19.6* 20.3* 29.7* 30.1*  MCV 85 89.8 86.3 87.8  PLT 473* 480* 476* 504*   Cardiac Enzymes:  Recent Labs Lab 04/21/17 1004  TROPONINI <0.03   BNP: Invalid input(s): POCBNP CBG:  Recent Labs Lab 04/21/17 0720 04/21/17 1234 04/21/17 2335 04/22/17 0739 04/22/17 1156  GLUCAP 126* 125* 168* 135* 175*   D-Dimer No results for input(s): DDIMER in the last 72 hours. Hgb A1c No results for input(s): HGBA1C in the last 72 hours. Lipid Profile No results for input(s): CHOL, HDL, LDLCALC, TRIG, CHOLHDL, LDLDIRECT in the last 72 hours. Thyroid function studies No results for input(s): TSH, T4TOTAL, T3FREE, THYROIDAB in the last 72 hours.  Invalid input(s): FREET3 Anemia work up No results for input(s): VITAMINB12, FOLATE, FERRITIN, TIBC, IRON, RETICCTPCT in the last 72 hours. Urinalysis    Component Value Date/Time   COLORURINE YELLOW 04/20/2017 [place] 04/20/2017 1712   LABSPEC 1.010 04/20/2017 1712   PHURINE 6.0 04/20/2017 1712   GLUCOSEU 50 (A) 04/20/2017 1712   HGBUR NEGATIVE 04/20/2017 1712   BILIRUBINUR NEGATIVE 04/20/2017 1712   KETONESUR NEGATIVE 04/20/2017 1712   PROTEINUR >=300 (A) 04/20/2017 1712   UROBILINOGEN 0.2 06/27/2014 0525   NITRITE NEGATIVE 04/20/2017 1712   LEUKOCYTESUR NEGATIVE 04/20/2017 1712   Sepsis Labs Invalid input(s): PROCALCITONIN,  WBC,  LACTICIDVEN Microbiology No results found for this or any previous visit (from the past 240 hour(s)).   Time coordinating discharge: Over 30 minutes  SIGNED:   Hosie Poisson, MD  [facility] 04/25/2017, 8:30 AM Pager   If 7PM-7AM, please contact night-coverage [email or web address] Password [email or web address]

## 2017-04-26 ENCOUNTER — Ambulatory Visit (INDEPENDENT_AMBULATORY_CARE_PROVIDER_SITE_OTHER): Payer: Medicare Other | Admitting: Family Medicine

## 2017-04-26 ENCOUNTER — Encounter: Payer: Self-pay | Admitting: Family Medicine

## 2017-04-26 VITALS — BP 206/72 | HR 70 | Temp 97.6°F | Resp 16 | Ht <= 58 in | Wt 120.6 lb

## 2017-04-26 DIAGNOSIS — E039 Hypothyroidism, unspecified: Secondary | ICD-10-CM | POA: Diagnosis not present

## 2017-04-26 DIAGNOSIS — N185 Chronic kidney disease, stage 5: Secondary | ICD-10-CM | POA: Diagnosis not present

## 2017-04-26 DIAGNOSIS — I10 Essential (primary) hypertension: Secondary | ICD-10-CM

## 2017-04-26 DIAGNOSIS — E118 Type 2 diabetes mellitus with unspecified complications: Secondary | ICD-10-CM

## 2017-04-26 DIAGNOSIS — D631 Anemia in chronic kidney disease: Secondary | ICD-10-CM | POA: Diagnosis not present

## 2017-04-26 MED ORDER — CARVEDILOL 3.125 MG PO TABS: 3.1250 mg | ORAL_TABLET | Freq: Two times a day (BID) | ORAL | 2 refills | Status: DC

## 2017-04-26 MED ORDER — AMLODIPINE BESYLATE 5 MG PO TABS: 5.0000 mg | ORAL_TABLET | Freq: Every day | ORAL | 2 refills | Status: DC

## 2017-04-26 MED ORDER — SYNTHROID 50 MCG PO TABS: 50.0000 ug | ORAL_TABLET | Freq: Every day | ORAL | 2 refills | Status: DC

## 2017-04-26 NOTE — Progress Notes (Signed)
10/16/20183:03 PM  Janet [name] 30-Jun-1924, 83 y.o. female [id number]  Chief Complaint  Patient presents with  . Hospitalization Follow-up    HPI:   Patient is a 83 y.o. female with past medical history significant for DM2, HTN, CKD, Hypothyroidism, anemia who presents today for hospital follow-up after being found with hgb 6.4, crt ~ 3.   S/p transfusion of 2u PRBCs Echo DD1, stable compared to 2015  Has not started meds prescribed at time of d/c Has not made appt with renal  Continues to feel tired and SOB. Not much different than when she went into the hospital.  Has no acute concerns today.  Depression screen [facility] 2/9 04/26/2017 04/19/2017  Decreased Interest 0 0  Down, Depressed, Hopeless 0 0  PHQ - 2 Score 0 0    Allergies  Allergen Reactions  . Other     Some generic medication, can only take most brand name medications    Prior to Admission medications   Medication Sig Start Date End Date Taking? Authorizing Provider  amLODipine (NORVASC) 5 MG tablet Take 1 tablet (5 mg total) by mouth daily. 04/23/17  Yes Hosie Poisson, MD  carvedilol (COREG) 3.125 MG tablet Take 1 tablet (3.125 mg total) by mouth 2 (two) times daily with a meal. 04/22/17  Yes Hosie Poisson, MD  levothyroxine (SYNTHROID, LEVOTHROID) 50 MCG tablet Take 1 tablet (50 mcg total) by mouth daily before breakfast. 04/23/17  Yes Hosie Poisson, MD  Multiple Vitamins-Minerals (CENTRUM ADULTS PO) Take by mouth.   Yes [provider]  OVER THE COUNTER MEDICATION Take 1 tablet by mouth daily. Diabetes Management Herbal Supplement   Yes [provider]  Tetrahydrozoline HCl (VISINE OP) Place 1-2 drops into both eyes daily as needed (watery eyes).   Yes [provider]    Past Medical History:  Diagnosis Date  . Anemia   . Arthritis   . Asthma   . Diabetes mellitus    type 2  . Family history of adverse reaction to anesthesia    daughter has " a allergic reaction to  anesthesia " does not know which one   . Gall stones   . Hypertension   . Kidney stones   . Thyroid disease     Past Surgical History:  Procedure Laterality Date  . ABDOMINAL HYSTERECTOMY    . CATARACT EXTRACTION Bilateral   . CHOLECYSTECTOMY    . HERNIA REPAIR     open    Social History  Substance Use Topics  . Smoking status: Never Smoker  . Smokeless tobacco: Never Used  . Alcohol use No    Family History  Problem Relation Age of Onset  . Heart attack Mother   . Cancer Brother   . Diabetes Other     Review of Systems  Constitutional: Negative for chills and fever.  Respiratory: Positive for cough and shortness of breath. Negative for sputum production and wheezing.   Cardiovascular: Positive for orthopnea and leg swelling. Negative for chest pain, palpitations and PND.  Gastrointestinal: Positive for heartburn and nausea. Negative for abdominal pain, blood in stool, constipation, diarrhea, melena and vomiting.  Genitourinary: Negative for dysuria, frequency, hematuria and urgency.  Neurological: Negative for dizziness and tingling.  Endo/Heme/Allergies: Negative for polydipsia.     OBJECTIVE:  Blood pressure (!) 206/72, pulse 70, temperature 97.6 F (36.4 C), temperature source Oral, resp. rate 16, height 4' 7.31" (1.405 m), weight 120 lb 9.6 oz (54.7 kg), SpO2 96 %.  Wt Readings  from Last 3 Encounters:  04/26/17 120 lb 9.6 oz (54.7 kg)  04/20/17 123 lb 3.8 oz (55.9 kg)  04/19/17 124 lb 6.4 oz (56.4 kg)    Physical Exam  Constitutional: She is oriented to person, place, and time and well-developed, well-nourished, and in no distress.  HENT:  Head: Normocephalic and atraumatic.  Mouth/Throat: Oropharynx is clear and moist. No oropharyngeal exudate.  Eyes: Pupils are equal, round, and reactive to light. EOM are normal. No scleral icterus.  Neck: Neck supple. No JVD present. No thyromegaly present.  Cardiovascular: Normal rate, regular rhythm and normal  heart sounds.  Exam reveals no gallop and no friction rub.   No murmur heard. Pulmonary/Chest: Effort normal and breath sounds normal. She has no wheezes. She has no rales.  Abdominal: Soft. Bowel sounds are normal. She exhibits no distension and no mass. There is no tenderness.  Musculoskeletal: She exhibits edema.  Neurological: She is alert and oriented to person, place, and time. Gait normal.  Skin: Skin is warm and dry.  Psychiatric: Mood and affect normal.  Nursing note and vitals reviewed.   ASSESSMENT and PLAN  1. CKD (chronic kidney disease), stage V ([place]) Long discussion regarding diagnoses, strongly encouraged making appt with renal.   - CBC with Differential - Basic Metabolic Panel - PTH, Intact and Calcium - Phosphorus - Vitamin D, 25-hydroxy  2. Anemia due to stage 5 chronic kidney disease, not on chronic dialysis ([place]) - CBC with Differential S/p transfusion, 2/2 CKD, defer to renal  3. Benign essential HTN Elevated today, was doing well in hospital, refilled meds. Strongly encouraged adherence.  4. Hypothyroidism, unspecified type Discussed importance of daily synthroid use which was restarted in hospital. Refilled medication. Recheck TSH in 8 weeks.  5. Type 2 diabetes mellitus with complication, without long-term current use of insulin (HCC) A1c 6.8, diet controlled.   Other orders - amLODipine (NORVASC) 5 MG tablet; Take 1 tablet (5 mg total) by mouth daily. - carvedilol (COREG) 3.125 MG tablet; Take 1 tablet (3.125 mg total) by mouth 2 (two) times daily with a meal. - SYNTHROID 50 MCG tablet; Take 1 tablet (50 mcg total) by mouth daily before breakfast.  Return in about 2 weeks (around 05/10/2017).    Rutherford Guys, MD [facility] [place], [place] [address] Ph.  [phone] Fax [phone]

## 2017-04-26 NOTE — Telephone Encounter (Signed)
This was 5 days ago. More of an FYI.

## 2017-04-26 NOTE — Patient Instructions (Signed)
     IF you received an x-ray today, you will receive an invoice from [facility]. Please contact [facility] at [phone] with questions or concerns regarding your invoice.   IF you received labwork today, you will receive an invoice from [facility]. Please contact [facility] at [phone] with questions or concerns regarding your invoice.   Our billing staff will not be able to assist you with questions regarding bills from these companies.  You will be contacted with the lab results as soon as they are available. The fastest way to get your results is to activate your My Chart account. Instructions are located on the last page of this paperwork. If you have not heard from us regarding the results in 2 weeks, please contact this office.     

## 2017-04-27 LAB — VITAMIN D 25 HYDROXY (VIT D DEFICIENCY, FRACTURES): Vit D, 25-Hydroxy: 31.9 ng/mL (ref 30.0–100.0)

## 2017-04-27 LAB — CBC WITH DIFFERENTIAL/PLATELET
Basophils Absolute: 0 10*3/uL (ref 0.0–0.2)
Basos: 0 %
EOS (ABSOLUTE): 0.2 10*3/uL (ref 0.0–0.4)
Eos: 3 %
Hematocrit: 29.6 % — ABNORMAL LOW (ref 34.0–46.6)
Hemoglobin: 9.7 g/dL — ABNORMAL LOW (ref 11.1–15.9)
Immature Grans (Abs): 0 10*3/uL (ref 0.0–0.1)
Immature Granulocytes: 0 %
Lymphocytes Absolute: 1.5 10*3/uL (ref 0.7–3.1)
Lymphs: 25 %
MCH: 28 pg (ref 26.6–33.0)
MCHC: 32.8 g/dL (ref 31.5–35.7)
MCV: 86 fL (ref 79–97)
Monocytes Absolute: 0.5 10*3/uL (ref 0.1–0.9)
Monocytes: 9 %
Neutrophils Absolute: 3.9 10*3/uL (ref 1.4–7.0)
Neutrophils: 63 %
Platelets: 453 10*3/uL — ABNORMAL HIGH (ref 150–379)
RBC: 3.46 x10E6/uL — ABNORMAL LOW (ref 3.77–5.28)
RDW: 17.7 % — ABNORMAL HIGH (ref 12.3–15.4)
WBC: 6.2 10*3/uL (ref 3.4–10.8)

## 2017-04-27 LAB — BASIC METABOLIC PANEL
BUN/Creatinine Ratio: 21 (ref 12–28)
BUN: 69 mg/dL — ABNORMAL HIGH (ref 10–36)
CO2: 21 mmol/L (ref 20–29)
Calcium: 8.9 mg/dL (ref 8.7–10.3)
Chloride: 104 mmol/L (ref 96–106)
Creatinine, Ser: 3.24 mg/dL (ref 0.57–1.00)
GFR calc Af Amer: 14 mL/min/{1.73_m2} — ABNORMAL LOW (ref >59)
GFR calc non Af Amer: 12 mL/min/{1.73_m2} — ABNORMAL LOW (ref >59)
Glucose: 94 mg/dL (ref 65–99)
Potassium: 5.4 mmol/L — ABNORMAL HIGH (ref 3.5–5.2)
Sodium: 140 mmol/L (ref 134–144)

## 2017-04-27 LAB — PHOSPHORUS: Phosphorus: 5 mg/dL — ABNORMAL HIGH (ref 2.5–4.5)

## 2017-04-27 LAB — PTH, INTACT AND CALCIUM: PTH: 95 pg/mL — ABNORMAL HIGH (ref 15–65)

## 2017-04-28 ENCOUNTER — Telehealth: Payer: Self-pay | Admitting: Family Medicine

## 2017-04-28 NOTE — Telephone Encounter (Signed)
Called [place] and gave verbal ok

## 2017-04-28 NOTE — Telephone Encounter (Signed)
Shaunda physical therapist from [facility] wanted a verbal order from Dr Pamella Pert for PT evaluation only and a skill nurse to monitor blood pressure please call Shaunda on cell at [phone]

## 2017-05-10 ENCOUNTER — Ambulatory Visit: Payer: Medicare Other | Admitting: Family Medicine

## 2017-05-13 DIAGNOSIS — I517 Cardiomegaly: Secondary | ICD-10-CM | POA: Diagnosis not present

## 2017-05-13 DIAGNOSIS — I120 Hypertensive chronic kidney disease with stage 5 chronic kidney disease or end stage renal disease: Secondary | ICD-10-CM | POA: Diagnosis not present

## 2017-05-13 DIAGNOSIS — E1122 Type 2 diabetes mellitus with diabetic chronic kidney disease: Secondary | ICD-10-CM | POA: Diagnosis not present

## 2017-05-13 DIAGNOSIS — E039 Hypothyroidism, unspecified: Secondary | ICD-10-CM | POA: Diagnosis not present

## 2017-05-13 DIAGNOSIS — N185 Chronic kidney disease, stage 5: Secondary | ICD-10-CM | POA: Diagnosis not present

## 2017-05-13 DIAGNOSIS — D649 Anemia, unspecified: Secondary | ICD-10-CM | POA: Diagnosis not present

## 2017-05-17 DIAGNOSIS — R8290 Unspecified abnormal findings in urine: Secondary | ICD-10-CM | POA: Diagnosis not present

## 2017-05-17 DIAGNOSIS — R410 Disorientation, unspecified: Secondary | ICD-10-CM | POA: Diagnosis not present

## 2017-05-17 DIAGNOSIS — N185 Chronic kidney disease, stage 5: Secondary | ICD-10-CM | POA: Diagnosis not present

## 2017-05-17 DIAGNOSIS — I10 Essential (primary) hypertension: Secondary | ICD-10-CM | POA: Diagnosis not present

## 2017-05-19 DIAGNOSIS — I517 Cardiomegaly: Secondary | ICD-10-CM | POA: Diagnosis not present

## 2017-05-19 DIAGNOSIS — D649 Anemia, unspecified: Secondary | ICD-10-CM | POA: Diagnosis not present

## 2017-05-19 DIAGNOSIS — N185 Chronic kidney disease, stage 5: Secondary | ICD-10-CM | POA: Diagnosis not present

## 2017-05-19 DIAGNOSIS — E039 Hypothyroidism, unspecified: Secondary | ICD-10-CM | POA: Diagnosis not present

## 2017-05-19 DIAGNOSIS — E1122 Type 2 diabetes mellitus with diabetic chronic kidney disease: Secondary | ICD-10-CM | POA: Diagnosis not present

## 2017-05-19 DIAGNOSIS — I120 Hypertensive chronic kidney disease with stage 5 chronic kidney disease or end stage renal disease: Secondary | ICD-10-CM | POA: Diagnosis not present

## 2017-05-27 DIAGNOSIS — E1122 Type 2 diabetes mellitus with diabetic chronic kidney disease: Secondary | ICD-10-CM | POA: Diagnosis not present

## 2017-05-27 DIAGNOSIS — E039 Hypothyroidism, unspecified: Secondary | ICD-10-CM | POA: Diagnosis not present

## 2017-05-27 DIAGNOSIS — D649 Anemia, unspecified: Secondary | ICD-10-CM | POA: Diagnosis not present

## 2017-05-27 DIAGNOSIS — I517 Cardiomegaly: Secondary | ICD-10-CM | POA: Diagnosis not present

## 2017-05-27 DIAGNOSIS — I120 Hypertensive chronic kidney disease with stage 5 chronic kidney disease or end stage renal disease: Secondary | ICD-10-CM | POA: Diagnosis not present

## 2017-05-27 DIAGNOSIS — N185 Chronic kidney disease, stage 5: Secondary | ICD-10-CM | POA: Diagnosis not present

## 2017-06-06 DIAGNOSIS — M546 Pain in thoracic spine: Secondary | ICD-10-CM | POA: Diagnosis not present

## 2017-06-06 DIAGNOSIS — R197 Diarrhea, unspecified: Secondary | ICD-10-CM | POA: Diagnosis not present

## 2017-06-06 DIAGNOSIS — G8929 Other chronic pain: Secondary | ICD-10-CM | POA: Diagnosis not present

## 2017-06-06 DIAGNOSIS — N185 Chronic kidney disease, stage 5: Secondary | ICD-10-CM | POA: Diagnosis not present

## 2017-06-10 DIAGNOSIS — N185 Chronic kidney disease, stage 5: Secondary | ICD-10-CM | POA: Diagnosis not present

## 2017-06-10 DIAGNOSIS — E039 Hypothyroidism, unspecified: Secondary | ICD-10-CM | POA: Diagnosis not present

## 2017-06-10 DIAGNOSIS — D649 Anemia, unspecified: Secondary | ICD-10-CM | POA: Diagnosis not present

## 2017-06-10 DIAGNOSIS — E1122 Type 2 diabetes mellitus with diabetic chronic kidney disease: Secondary | ICD-10-CM | POA: Diagnosis not present

## 2017-06-10 DIAGNOSIS — I517 Cardiomegaly: Secondary | ICD-10-CM | POA: Diagnosis not present

## 2017-06-10 DIAGNOSIS — I120 Hypertensive chronic kidney disease with stage 5 chronic kidney disease or end stage renal disease: Secondary | ICD-10-CM | POA: Diagnosis not present

## 2017-06-17 DIAGNOSIS — I517 Cardiomegaly: Secondary | ICD-10-CM | POA: Diagnosis not present

## 2017-06-17 DIAGNOSIS — D649 Anemia, unspecified: Secondary | ICD-10-CM | POA: Diagnosis not present

## 2017-06-17 DIAGNOSIS — N185 Chronic kidney disease, stage 5: Secondary | ICD-10-CM | POA: Diagnosis not present

## 2017-06-17 DIAGNOSIS — E039 Hypothyroidism, unspecified: Secondary | ICD-10-CM | POA: Diagnosis not present

## 2017-06-17 DIAGNOSIS — E1122 Type 2 diabetes mellitus with diabetic chronic kidney disease: Secondary | ICD-10-CM | POA: Diagnosis not present

## 2017-06-17 DIAGNOSIS — I120 Hypertensive chronic kidney disease with stage 5 chronic kidney disease or end stage renal disease: Secondary | ICD-10-CM | POA: Diagnosis not present

## 2017-06-24 DIAGNOSIS — I120 Hypertensive chronic kidney disease with stage 5 chronic kidney disease or end stage renal disease: Secondary | ICD-10-CM | POA: Diagnosis not present

## 2017-06-24 DIAGNOSIS — E039 Hypothyroidism, unspecified: Secondary | ICD-10-CM | POA: Diagnosis not present

## 2017-06-24 DIAGNOSIS — E1122 Type 2 diabetes mellitus with diabetic chronic kidney disease: Secondary | ICD-10-CM | POA: Diagnosis not present

## 2017-06-24 DIAGNOSIS — I517 Cardiomegaly: Secondary | ICD-10-CM | POA: Diagnosis not present

## 2017-06-24 DIAGNOSIS — D649 Anemia, unspecified: Secondary | ICD-10-CM | POA: Diagnosis not present

## 2017-06-24 DIAGNOSIS — N185 Chronic kidney disease, stage 5: Secondary | ICD-10-CM | POA: Diagnosis not present

## 2017-06-28 DIAGNOSIS — N185 Chronic kidney disease, stage 5: Secondary | ICD-10-CM | POA: Diagnosis not present

## 2017-06-28 DIAGNOSIS — I10 Essential (primary) hypertension: Secondary | ICD-10-CM | POA: Diagnosis not present

## 2017-06-28 DIAGNOSIS — D631 Anemia in chronic kidney disease: Secondary | ICD-10-CM | POA: Diagnosis not present

## 2017-06-28 DIAGNOSIS — R0602 Shortness of breath: Secondary | ICD-10-CM | POA: Diagnosis not present

## 2017-07-06 ENCOUNTER — Other Ambulatory Visit: Payer: Self-pay | Admitting: Family Medicine

## 2017-07-08 DIAGNOSIS — I120 Hypertensive chronic kidney disease with stage 5 chronic kidney disease or end stage renal disease: Secondary | ICD-10-CM | POA: Diagnosis not present

## 2017-07-08 DIAGNOSIS — N185 Chronic kidney disease, stage 5: Secondary | ICD-10-CM | POA: Diagnosis not present

## 2017-07-08 DIAGNOSIS — E1122 Type 2 diabetes mellitus with diabetic chronic kidney disease: Secondary | ICD-10-CM | POA: Diagnosis not present

## 2017-07-08 DIAGNOSIS — N189 Chronic kidney disease, unspecified: Secondary | ICD-10-CM | POA: Diagnosis not present

## 2017-07-08 DIAGNOSIS — D631 Anemia in chronic kidney disease: Secondary | ICD-10-CM | POA: Diagnosis not present

## 2017-07-08 DIAGNOSIS — M1990 Unspecified osteoarthritis, unspecified site: Secondary | ICD-10-CM | POA: Diagnosis not present

## 2017-07-08 DIAGNOSIS — R0602 Shortness of breath: Secondary | ICD-10-CM | POA: Diagnosis not present

## 2017-07-08 DIAGNOSIS — D649 Anemia, unspecified: Secondary | ICD-10-CM | POA: Diagnosis not present

## 2017-07-08 DIAGNOSIS — I160 Hypertensive urgency: Secondary | ICD-10-CM | POA: Diagnosis not present

## 2017-07-08 DIAGNOSIS — N179 Acute kidney failure, unspecified: Secondary | ICD-10-CM | POA: Diagnosis not present

## 2017-07-08 DIAGNOSIS — Z79899 Other long term (current) drug therapy: Secondary | ICD-10-CM | POA: Diagnosis not present

## 2017-07-08 DIAGNOSIS — Z66 Do not resuscitate: Secondary | ICD-10-CM | POA: Diagnosis not present

## 2017-07-08 DIAGNOSIS — R531 Weakness: Secondary | ICD-10-CM | POA: Diagnosis not present

## 2017-07-08 DIAGNOSIS — E039 Hypothyroidism, unspecified: Secondary | ICD-10-CM | POA: Diagnosis not present

## 2017-07-08 DIAGNOSIS — I517 Cardiomegaly: Secondary | ICD-10-CM | POA: Diagnosis not present

## 2017-07-09 DIAGNOSIS — N189 Chronic kidney disease, unspecified: Secondary | ICD-10-CM | POA: Diagnosis not present

## 2017-07-09 DIAGNOSIS — E039 Hypothyroidism, unspecified: Secondary | ICD-10-CM | POA: Diagnosis not present

## 2017-07-09 DIAGNOSIS — M5144 Schmorl's nodes, thoracic region: Secondary | ICD-10-CM | POA: Diagnosis not present

## 2017-07-09 DIAGNOSIS — R918 Other nonspecific abnormal finding of lung field: Secondary | ICD-10-CM | POA: Diagnosis not present

## 2017-07-09 DIAGNOSIS — R531 Weakness: Secondary | ICD-10-CM | POA: Diagnosis not present

## 2017-07-09 DIAGNOSIS — D649 Anemia, unspecified: Secondary | ICD-10-CM | POA: Diagnosis not present

## 2017-07-09 DIAGNOSIS — G8929 Other chronic pain: Secondary | ICD-10-CM | POA: Diagnosis not present

## 2017-07-09 DIAGNOSIS — I1310 Hypertensive heart and chronic kidney disease without heart failure, with stage 1 through stage 4 chronic kidney disease, or unspecified chronic kidney disease: Secondary | ICD-10-CM | POA: Diagnosis not present

## 2017-07-09 DIAGNOSIS — R110 Nausea: Secondary | ICD-10-CM | POA: Diagnosis not present

## 2017-07-09 DIAGNOSIS — R5383 Other fatigue: Secondary | ICD-10-CM | POA: Diagnosis not present

## 2017-07-09 DIAGNOSIS — I160 Hypertensive urgency: Secondary | ICD-10-CM | POA: Diagnosis not present

## 2017-07-09 DIAGNOSIS — N185 Chronic kidney disease, stage 5: Secondary | ICD-10-CM | POA: Diagnosis not present

## 2017-07-09 DIAGNOSIS — N179 Acute kidney failure, unspecified: Secondary | ICD-10-CM | POA: Diagnosis not present

## 2017-07-09 DIAGNOSIS — D473 Essential (hemorrhagic) thrombocythemia: Secondary | ICD-10-CM | POA: Diagnosis not present

## 2017-07-09 DIAGNOSIS — M545 Low back pain: Secondary | ICD-10-CM | POA: Diagnosis not present

## 2017-07-09 DIAGNOSIS — K5730 Diverticulosis of large intestine without perforation or abscess without bleeding: Secondary | ICD-10-CM | POA: Diagnosis not present

## 2017-07-09 DIAGNOSIS — I10 Essential (primary) hypertension: Secondary | ICD-10-CM | POA: Diagnosis not present

## 2017-07-09 DIAGNOSIS — E1122 Type 2 diabetes mellitus with diabetic chronic kidney disease: Secondary | ICD-10-CM | POA: Diagnosis not present

## 2017-07-09 DIAGNOSIS — I120 Hypertensive chronic kidney disease with stage 5 chronic kidney disease or end stage renal disease: Secondary | ICD-10-CM | POA: Diagnosis not present

## 2017-07-09 DIAGNOSIS — I444 Left anterior fascicular block: Secondary | ICD-10-CM | POA: Diagnosis not present

## 2017-07-10 DIAGNOSIS — R918 Other nonspecific abnormal finding of lung field: Secondary | ICD-10-CM | POA: Diagnosis not present

## 2017-07-10 DIAGNOSIS — I444 Left anterior fascicular block: Secondary | ICD-10-CM | POA: Diagnosis not present

## 2017-07-10 DIAGNOSIS — K5730 Diverticulosis of large intestine without perforation or abscess without bleeding: Secondary | ICD-10-CM | POA: Diagnosis not present

## 2017-07-15 DIAGNOSIS — I10 Essential (primary) hypertension: Secondary | ICD-10-CM | POA: Diagnosis not present

## 2017-07-15 DIAGNOSIS — N179 Acute kidney failure, unspecified: Secondary | ICD-10-CM | POA: Diagnosis not present

## 2017-07-15 DIAGNOSIS — N185 Chronic kidney disease, stage 5: Secondary | ICD-10-CM | POA: Diagnosis not present

## 2017-07-15 DIAGNOSIS — Z7689 Persons encountering health services in other specified circumstances: Secondary | ICD-10-CM | POA: Diagnosis not present

## 2017-07-15 DIAGNOSIS — R7302 Impaired glucose tolerance (oral): Secondary | ICD-10-CM | POA: Diagnosis not present

## 2017-07-15 DIAGNOSIS — E1122 Type 2 diabetes mellitus with diabetic chronic kidney disease: Secondary | ICD-10-CM | POA: Diagnosis not present

## 2017-07-15 DIAGNOSIS — D638 Anemia in other chronic diseases classified elsewhere: Secondary | ICD-10-CM | POA: Diagnosis not present

## 2017-07-27 DIAGNOSIS — I120 Hypertensive chronic kidney disease with stage 5 chronic kidney disease or end stage renal disease: Secondary | ICD-10-CM | POA: Diagnosis not present

## 2017-07-27 DIAGNOSIS — E1122 Type 2 diabetes mellitus with diabetic chronic kidney disease: Secondary | ICD-10-CM | POA: Diagnosis not present

## 2017-07-27 DIAGNOSIS — N185 Chronic kidney disease, stage 5: Secondary | ICD-10-CM | POA: Diagnosis not present

## 2017-07-27 DIAGNOSIS — D631 Anemia in chronic kidney disease: Secondary | ICD-10-CM | POA: Diagnosis not present

## 2017-07-27 DIAGNOSIS — R809 Proteinuria, unspecified: Secondary | ICD-10-CM | POA: Diagnosis not present

## 2017-07-27 DIAGNOSIS — E1129 Type 2 diabetes mellitus with other diabetic kidney complication: Secondary | ICD-10-CM | POA: Diagnosis not present

## 2017-08-01 ENCOUNTER — Other Ambulatory Visit: Payer: Self-pay

## 2017-08-01 DIAGNOSIS — N185 Chronic kidney disease, stage 5: Secondary | ICD-10-CM

## 2017-08-03 DIAGNOSIS — D638 Anemia in other chronic diseases classified elsewhere: Secondary | ICD-10-CM | POA: Diagnosis not present

## 2017-08-03 DIAGNOSIS — E039 Hypothyroidism, unspecified: Secondary | ICD-10-CM | POA: Diagnosis not present

## 2017-08-03 DIAGNOSIS — I10 Essential (primary) hypertension: Secondary | ICD-10-CM | POA: Diagnosis not present

## 2017-08-03 DIAGNOSIS — R54 Age-related physical debility: Secondary | ICD-10-CM | POA: Diagnosis not present

## 2017-08-03 DIAGNOSIS — N185 Chronic kidney disease, stage 5: Secondary | ICD-10-CM | POA: Diagnosis not present

## 2017-08-08 ENCOUNTER — Encounter (HOSPITAL_COMMUNITY): Payer: Medicare Other

## 2017-08-08 NOTE — Discharge Instructions (Signed)

## 2017-08-09 ENCOUNTER — Encounter (HOSPITAL_COMMUNITY)
Admission: RE | Admit: 2017-08-09 | Discharge: 2017-08-09 | Disposition: A | Discharge status: Home / Self Care | Payer: Medicare Other | Source: Ambulatory Visit | Attending: Nephrology | Admitting: Nephrology

## 2017-08-09 VITALS — BP 142/55 | HR 65 | Temp 97.8°F | Resp 20

## 2017-08-09 DIAGNOSIS — D631 Anemia in chronic kidney disease: Secondary | ICD-10-CM | POA: Diagnosis not present

## 2017-08-09 DIAGNOSIS — N185 Chronic kidney disease, stage 5: Secondary | ICD-10-CM | POA: Diagnosis not present

## 2017-08-09 LAB — POCT HEMOGLOBIN-HEMACUE: Hemoglobin: 8.4 g/dL — ABNORMAL LOW (ref 12.0–15.0)

## 2017-08-09 MED ORDER — DARBEPOETIN ALFA 40 MCG/0.4ML IJ SOSY
40.0000 ug | PREFILLED_SYRINGE | INTRAMUSCULAR | Status: DC
  Administered 2017-08-09: 40 ug via SUBCUTANEOUS

## 2017-08-09 MED ORDER — DARBEPOETIN ALFA 40 MCG/0.4ML IJ SOSY
PREFILLED_SYRINGE | INTRAMUSCULAR | Status: AC
  Filled 2017-08-09: qty 0.4

## 2017-08-10 DIAGNOSIS — I129 Hypertensive chronic kidney disease with stage 1 through stage 4 chronic kidney disease, or unspecified chronic kidney disease: Secondary | ICD-10-CM | POA: Diagnosis not present

## 2017-08-24 ENCOUNTER — Other Ambulatory Visit (HOSPITAL_COMMUNITY): Payer: Self-pay | Admitting: *Deleted

## 2017-08-25 ENCOUNTER — Encounter (HOSPITAL_COMMUNITY): Payer: Medicare Other

## 2017-09-06 DIAGNOSIS — N185 Chronic kidney disease, stage 5: Secondary | ICD-10-CM | POA: Diagnosis not present

## 2017-09-06 DIAGNOSIS — D631 Anemia in chronic kidney disease: Secondary | ICD-10-CM | POA: Diagnosis not present

## 2017-09-06 DIAGNOSIS — E1122 Type 2 diabetes mellitus with diabetic chronic kidney disease: Secondary | ICD-10-CM | POA: Diagnosis not present

## 2017-09-06 DIAGNOSIS — I120 Hypertensive chronic kidney disease with stage 5 chronic kidney disease or end stage renal disease: Secondary | ICD-10-CM | POA: Diagnosis not present

## 2017-09-06 DIAGNOSIS — R809 Proteinuria, unspecified: Secondary | ICD-10-CM | POA: Diagnosis not present

## 2017-09-07 ENCOUNTER — Inpatient Hospital Stay (HOSPITAL_COMMUNITY): Admission: RE | Admit: 2017-09-07 | Payer: Medicare Other | Source: Ambulatory Visit

## 2017-09-09 ENCOUNTER — Inpatient Hospital Stay (HOSPITAL_COMMUNITY)
Admission: RE | Admit: 2017-09-09 | Discharge: 2017-09-09 | Disposition: A | Discharge status: Home / Self Care | Payer: Medicare Other | Source: Ambulatory Visit | Attending: Nephrology | Admitting: Nephrology

## 2017-09-12 ENCOUNTER — Ambulatory Visit: Payer: Medicare Other | Admitting: Dietician

## 2017-09-15 ENCOUNTER — Other Ambulatory Visit (HOSPITAL_COMMUNITY): Payer: Medicare Other

## 2017-09-15 ENCOUNTER — Encounter: Payer: Medicare Other | Admitting: Vascular Surgery

## 2017-09-15 ENCOUNTER — Encounter (HOSPITAL_COMMUNITY): Payer: Medicare Other

## 2017-09-21 ENCOUNTER — Encounter (HOSPITAL_COMMUNITY): Payer: Medicare Other

## 2017-09-30 ENCOUNTER — Ambulatory Visit: Payer: Medicare Other | Admitting: Dietician

## 2017-10-13 DIAGNOSIS — H26492 Other secondary cataract, left eye: Secondary | ICD-10-CM | POA: Diagnosis not present

## 2017-10-13 DIAGNOSIS — H26491 Other secondary cataract, right eye: Secondary | ICD-10-CM | POA: Diagnosis not present

## 2018-04-01 ENCOUNTER — Emergency Department (HOSPITAL_COMMUNITY): Payer: Medicare Other

## 2018-04-01 ENCOUNTER — Observation Stay (HOSPITAL_COMMUNITY)
Admission: EM | Admit: 2018-04-01 | Discharge: 2018-04-03 | Disposition: A | Discharge status: Home / Self Care | Payer: Medicare Other | Attending: Family Medicine | Admitting: Family Medicine

## 2018-04-01 ENCOUNTER — Other Ambulatory Visit: Payer: Self-pay

## 2018-04-01 ENCOUNTER — Encounter (HOSPITAL_COMMUNITY): Payer: Self-pay

## 2018-04-01 DIAGNOSIS — Z8249 Family history of ischemic heart disease and other diseases of the circulatory system: Secondary | ICD-10-CM | POA: Diagnosis not present

## 2018-04-01 DIAGNOSIS — I132 Hypertensive heart and chronic kidney disease with heart failure and with stage 5 chronic kidney disease, or end stage renal disease: Secondary | ICD-10-CM | POA: Diagnosis not present

## 2018-04-01 DIAGNOSIS — D649 Anemia, unspecified: Secondary | ICD-10-CM

## 2018-04-01 DIAGNOSIS — R0789 Other chest pain: Secondary | ICD-10-CM | POA: Diagnosis not present

## 2018-04-01 DIAGNOSIS — M1990 Unspecified osteoarthritis, unspecified site: Secondary | ICD-10-CM | POA: Diagnosis not present

## 2018-04-01 DIAGNOSIS — Z79899 Other long term (current) drug therapy: Secondary | ICD-10-CM | POA: Insufficient documentation

## 2018-04-01 DIAGNOSIS — E1122 Type 2 diabetes mellitus with diabetic chronic kidney disease: Secondary | ICD-10-CM | POA: Insufficient documentation

## 2018-04-01 DIAGNOSIS — E039 Hypothyroidism, unspecified: Secondary | ICD-10-CM | POA: Diagnosis not present

## 2018-04-01 DIAGNOSIS — I10 Essential (primary) hypertension: Secondary | ICD-10-CM | POA: Diagnosis not present

## 2018-04-01 DIAGNOSIS — N185 Chronic kidney disease, stage 5: Secondary | ICD-10-CM | POA: Diagnosis not present

## 2018-04-01 DIAGNOSIS — Z9114 Patient's other noncompliance with medication regimen: Secondary | ICD-10-CM | POA: Insufficient documentation

## 2018-04-01 DIAGNOSIS — R079 Chest pain, unspecified: Secondary | ICD-10-CM | POA: Diagnosis not present

## 2018-04-01 DIAGNOSIS — R5381 Other malaise: Secondary | ICD-10-CM

## 2018-04-01 DIAGNOSIS — D631 Anemia in chronic kidney disease: Secondary | ICD-10-CM | POA: Diagnosis not present

## 2018-04-01 DIAGNOSIS — E119 Type 2 diabetes mellitus without complications: Secondary | ICD-10-CM

## 2018-04-01 DIAGNOSIS — I5032 Chronic diastolic (congestive) heart failure: Secondary | ICD-10-CM

## 2018-04-01 LAB — CBC
HEMATOCRIT: 20.2 % — AB (ref 36.0–46.0)
HEMOGLOBIN: 6.4 g/dL — AB (ref 12.0–15.0)
MCH: 30.5 pg (ref 26.0–34.0)
MCHC: 31.7 g/dL (ref 30.0–36.0)
MCV: 96.2 fL (ref 78.0–100.0)
Platelets: 532 10*3/uL — ABNORMAL HIGH (ref 150–400)
RBC: 2.1 MIL/uL — AB (ref 3.87–5.11)
RDW: 16.8 % — ABNORMAL HIGH (ref 11.5–15.5)
WBC: 7.5 10*3/uL (ref 4.0–10.5)

## 2018-04-01 LAB — BASIC METABOLIC PANEL
Anion gap: 10 (ref 5–15)
BUN: 60 mg/dL — ABNORMAL HIGH (ref 8–23)
CHLORIDE: 107 mmol/L (ref 98–111)
CO2: 24 mmol/L (ref 22–32)
CREATININE: 4.76 mg/dL — AB (ref 0.44–1.00)
Calcium: 8.5 mg/dL — ABNORMAL LOW (ref 8.9–10.3)
GFR calc Af Amer: 8 mL/min — ABNORMAL LOW (ref >60)
GFR calc non Af Amer: 7 mL/min — ABNORMAL LOW (ref >60)
Glucose, Bld: 129 mg/dL — ABNORMAL HIGH (ref 70–99)
Potassium: 4.6 mmol/L (ref 3.5–5.1)
SODIUM: 141 mmol/L (ref 135–145)

## 2018-04-01 LAB — I-STAT TROPONIN, ED: TROPONIN I, POC: 0.03 ng/mL (ref 0.00–0.08)

## 2018-04-01 LAB — BRAIN NATRIURETIC PEPTIDE: B Natriuretic Peptide: 1084.1 pg/mL — ABNORMAL HIGH (ref 0.0–100.0)

## 2018-04-01 MED ORDER — SODIUM CHLORIDE 0.9 % IV SOLN: 10.0000 mL/h | Freq: Once | INTRAVENOUS | Status: DC

## 2018-04-01 NOTE — H&P (Signed)
Janet [name] ZYY:[id number] DOB: 1924-04-02 DOA: 04/01/2018     PCP: Patient, No Pcp Per   Outpatient Specialists:   CARDS:  Dr. Carlis Abbott, Merrie Roof, MD but never seen her NEphrology:  Unsure about the name.    Patient arrived to ER on 04/01/18 at 2106  Patient coming from: home Lives   With family    Chief Complaint:  Chief Complaint  Patient presents with  . Chest Pain    HPI: Janet [name] is a 84 y.o. female with medical history significant of CKD,  diabetes, high blood pressure, anemia, gallstones, hypothyroidism    Presented with Shortness of breath, brief chest pain today for few minutes. Generalized fatigue.  Chest pain has been occasional.  Patient reports palpitations. no hx of Dark stools or blood in stools.  She feels like in the past when she had anemia. Last admission for the same 1 year ago in October 2018 ws felt to be due to anemia of chronic disease requiring 2 PRBC Hg initially 6.2 after 2 units 9.8 VItamin B 12 and Folate wnl hemoccult neg.  Of note patient has been in the past seen by nephrology and used to get shots for her anemia but stopped going because she was told that she may need dialysis and she did not want to go on dialysis.  So she has not been seen by nephrology for years.  Patient used to be taking Norvasc and Coreg but have not been able to do so because she states she is allergic to generics and her insurance would not cover brand names. Her blood pressure has been running elevated at home.  Had extensive discussion regarding goals of care. Patient has son who has disappeared over 15 years ago.  She is still waiting for him to return.  Patient would like to do anything possible to give her more time because she believes her son will come back.  She states that she would like to have cardiac work-up as needed if there is any cardiac issues.  But she was not interested in hemodialysis at this point explained to her importance of follow-up  with nephrology for her long-standing history of anemia of chronic disease.  Overall medication adjustments to preserved kidney function  Regarding pertinent Chronic problems: during past admission in 2018  Last ECHO: grade 1 diastolic dysfunction Renal ultrasound shows medical renal disease. While in ER: Cr up to 4.7 BNP 1084 Trop 0.03  The following Work up has been ordered so far:  Orders Placed This Encounter  Procedures  . DG Chest 2 View  . Basic metabolic panel  . CBC  . Brain natriuretic peptide  . Cardiac monitoring  . Sal[place] Lock IV, Maintain IV access  . Complete patient signature process for consent form  . Practitioner attestation of consent  . Consult to hospitalist  . Pulse oximetry, continuous  . I-stat troponin, ED  . EKG 12-Lead  . ED EKG within 10 minutes  . Prepare RBC  . Type and screen     Following Medications were ordered in ER: Medications  0.9 %  sodium chloride infusion (has no administration in time range)    Significant initial  Findings: Abnormal Labs Reviewed  BASIC METABOLIC PANEL - Abnormal; Notable for the following components:      Result Value   Glucose, Bld 129 (*)    BUN 60 (*)    Creatinine, Ser 4.76 (*)    Calcium 8.5 (*)    GFR  calc non Af Amer 7 (*)    GFR calc Af Amer 8 (*)    All other components within normal limits  CBC - Abnormal; Notable for the following components:   RBC 2.10 (*)    Hemoglobin 6.4 (*)    HCT 20.2 (*)    RDW 16.8 (*)    Platelets 532 (*)    All other components within normal limits  BRAIN NATRIURETIC PEPTIDE - Abnormal; Notable for the following components:   B Natriuretic Peptide 1,084.1 (*)    All other components within normal limits     Na 141 K 4.6  Cr   Up from baseline see below Lab Results  Component Value Date   CREATININE 4.76 (H) 04/01/2018   CREATININE 3.24 (>) 04/26/2017   CREATININE 3.06 (H) 04/22/2017      WBC  7.5  HG/HCT   Down from baseline see below      Component Value Date/Time   HGB 6.4 (LL) 04/01/2018 2220   HGB 9.7 (L) 04/26/2017 1704   HCT 20.2 (L) 04/01/2018 2220   HCT 29.6 (L) 04/26/2017 1704     Troponin (Point of Care Test) Recent Labs    04/01/18 2230  TROPIPOC 0.03    BNP (last 3 results) Recent Labs    04/19/17 1702 04/20/17 1234 04/01/18 2233  BNP 401.3* 623.6* 1,084.1*    ProBNP (last 3 results) No results for input(s): PROBNP in the last 8760 hours.  Lactic Acid, Venous No results found for: LATICACIDVEN    UA  ordered   CXR -  NON acute, Cardiomegaly    ECG:  Personally reviewed by me showing: HR : 82 Rhythm: NSR  Non specific ischemic changes QTC 440    ED Triage Vitals  Enc Vitals Group     BP 04/01/18 2117 (!) 227/80     Pulse Rate 04/01/18 2117 84     Resp 04/01/18 2117 16     Temp 04/01/18 2117 98 F (36.7 C)     Temp Source 04/01/18 2117 Oral     SpO2 04/01/18 2117 98 %     Weight --      Height --      Head Circumference --      Peak Flow --      Pain Score 04/01/18 2120 5     Pain Loc --      Pain Edu? --      Excl. in [place]? --   TMAX(24)@       Latest  Blood pressure (!) 206/66, pulse 76, temperature 98 F (36.7 C), temperature source Oral, resp. rate 13, SpO2 97 %.     Hospitalist was called for admission for symptomatic anemia   Review of Systems:    Pertinent positives include:  fatigue,dizziness, palpitations, Bilateral lower extremity swelling   Constitutional:  No weight loss, night sweats, Fevers, chills, weight loss  HEENT:  No headaches, Difficulty swallowing,Tooth/dental problems,Sore throat,  No sneezing, itching, ear ache, nasal congestion, post nasal drip,  Cardio-vascular:  No chest pain, Orthopnea, PND, anasarca, .no  GI:  No heartburn, indigestion, abdominal pain, nausea, vomiting, diarrhea, change in bowel habits, loss of appetite, melena, blood in stool, hematemesis Resp:  no shortness of breath at rest. No dyspnea on exertion, No excess  mucus, no productive cough, No non-productive cough, No coughing up of blood.No change in color of mucus.No wheezing. Skin:  no rash or lesions. No jaundice GU:  no dysuria, change in color of  urine, no urgency or frequency. No straining to urinate.  No flank pain.  Musculoskeletal:  No joint pain or no joint swelling. No decreased range of motion. No back pain.  Psych:  No change in mood or affect. No depression or anxiety. No memory loss.  Neuro: no localizing neurological complaints, no tingling, no weakness, no double vision, no gait abnormality, no slurred speech, no confusion  All systems reviewed and apart from [place] all are negative  Past Medical History:   Past Medical History:  Diagnosis Date  . Anemia   . Arthritis   . Asthma   . Diabetes mellitus    type 2  . Family history of adverse reaction to anesthesia    daughter has " a allergic reaction to anesthesia " does not know which one   . Gall stones   . Hypertension   . Kidney stones   . Thyroid disease       Past Surgical History:  Procedure Laterality Date  . ABDOMINAL HYSTERECTOMY    . CATARACT EXTRACTION Bilateral   . CHOLECYSTECTOMY    . HERNIA REPAIR     open    Social History:  Ambulatory   independently or  cane,       reports that she has never smoked. She has never used smokeless tobacco. She reports that she does not drink alcohol or use drugs.     Family History:   Family History  Problem Relation Age of Onset  . Heart attack Mother   . Cancer Brother   . Diabetes Other     Allergies: Allergies  Allergen Reactions  . Other     Some generic medication, can only take most brand name medications     Prior to Admission medications   Medication Sig Start Date End Date Taking? Authorizing Provider  amLODipine (NORVASC) 5 MG tablet Take 1 tablet (5 mg total) by mouth daily. 04/26/17   Rutherford Guys, MD  carvedilol (COREG) 3.125 MG tablet Take 1 tablet (3.125 mg total) by mouth 2  (two) times daily with a meal. 04/26/17   Rutherford Guys, MD  Multiple Vitamins-Minerals (CENTRUM ADULTS PO) Take by mouth.    [provider]  OVER THE COUNTER MEDICATION Take 1 tablet by mouth daily. Diabetes Management Herbal Supplement    [provider]  SYNTHROID 50 MCG tablet Take 1 tablet (50 mcg total) by mouth daily before breakfast. 04/26/17   Rutherford Guys, MD  Tetrahydrozoline HCl (VISINE OP) Place 1-2 drops into both eyes daily as needed (watery eyes).    [provider]   Physical Exam: Blood pressure (!) 206/66, pulse 76, temperature 98 F (36.7 C), temperature source Oral, resp. rate 13, SpO2 97 %. 1. General:  in No Acute distress   Chronically ill -appearing 2. Psychological: Alert and   Oriented 3. Head/ENT:   Moist   Mucous Membranes                          Head Non traumatic, neck supple                           Poor Dentition 4. SKIN: normal  Skin turgor,  Skin clean Dry and intact no rash 5. Heart: Regular rate and rhythm systolic Murmur, no Rub or gallop 6. Lungs:   no wheezes or crackles   7. Abdomen: Soft,  non-tender, Non distended  Obese bowel sounds present 8. Lower extremities: no clubbing, cyanosis, 2+edema 9. Neurologically Grossly intact, moving all 4 extremities equally   10. MSK: Normal range of motion   LABS:     Recent Labs  Lab 04/01/18 2220  WBC 7.5  HGB 6.4*  HCT 20.2*  MCV 96.2  PLT 469*   Basic Metabolic Panel: Recent Labs  Lab 04/01/18 2220  NA 141  K 4.6  CL 107  CO2 24  GLUCOSE 129*  BUN 60*  CREATININE 4.76*  CALCIUM 8.5*      No results for input(s): AST, ALT, ALKPHOS, BILITOT, PROT, ALBUMIN in the last 168 hours. No results for input(s): LIPASE, AMYLASE in the last 168 hours. No results for input(s): AMMONIA in the last 168 hours.    HbA1C: No results for input(s): HGBA1C in the last 72 hours. CBG: No results for input(s): GLUCAP in the last 168 hours.    Urine analysis:     Component Value Date/Time   COLORURINE YELLOW 04/20/2017 [place] 04/20/2017 1712   LABSPEC 1.010 04/20/2017 1712   PHURINE 6.0 04/20/2017 1712   GLUCOSEU 50 (A) 04/20/2017 1712   HGBUR NEGATIVE 04/20/2017 1712   BILIRUBINUR NEGATIVE 04/20/2017 1712   KETONESUR NEGATIVE 04/20/2017 1712   PROTEINUR >=300 (A) 04/20/2017 1712   UROBILINOGEN 0.2 06/27/2014 0525   NITRITE NEGATIVE 04/20/2017 1712   LEUKOCYTESUR NEGATIVE 04/20/2017 1712    Cultures:    Component Value Date/Time   SDES URINE, CLEAN CATCH 06/27/2014 1914   SPECREQUEST NONE 06/27/2014 1914   CULT  06/27/2014 1914    Multiple bacterial morphotypes present, none predominant. Suggest appropriate recollection if clinically indicated. Performed at [place] 06/28/2014 FINAL 06/27/2014 1914     Radiological Exams on Admission: Dg Chest 2 View  Result Date: 04/01/2018 CLINICAL DATA:  Left side chest pain EXAM: CHEST - 2 VIEW COMPARISON:  07/10/2017 FINDINGS: Cardiomegaly. No overt edema. No confluent opacities or effusions. No acute bony abnormality. IMPRESSION: Cardiomegaly.  No active disease. Electronically Signed   By: Rolm Baptise M.D.   On: 04/01/2018 21:55    Chart has been reviewed    Assessment/Plan   84 y.o. female with medical history significant of CKD,  diabetes, high blood pressure, anemia, gallstones, hypothyroidism     Admitted for symptomatic anemia  Present on Admission:  . Symptomatic anemia we will transfuse 2 units, check anemia panel and Hemoccult stool suspect likely secondary to anemia of chronic disease . Hypothyroidism -- Check TSH continue home medications at current dose  . Debility will have PT OT evaluate prior to discharge . Chest pain in the setting of anemia we will cycle cardiac enzymes monitor on telemetry and transfuse . Chronic diastolic CHF (congestive heart failure) (HCC) noted to have elevated BNP and peripheral edema suspect  somewhat fluid up.  Give Lasix in between transfusion units and monitor fluid status to avoid fluid overload . CKD (chronic kidney disease), stage V (HCC) worsening kidney function suspect chronic at this point no indication for hemodialysis.  Patient was strongly encouraged to follow-up with nephrology regarding long-standing history of kidney failure as well as anemia of chronic disease secondary to chronic kidney failure . Benign essential HTN -poorly controlled patient has not been taking her medications for years family is concerned explained to them that need to be careful to avoid over aggressive management will restart home medications and monitor blood pressure patient states she can only take brand  names. DM 2 -  - Order Sensitive SSI     -  check TSH and HgA1C Patient not on any medications at home diet controlled    Other plan as per orders.  DVT prophylaxis:  SCD    Code Status:  FULL CODE as per patient   I had personally discussed CODE STATUS with patient and family  I had spent 20 min discussing goals of care and CODE STATUS  Family Communication:   Family   at  Bedside  plan of care was discussed with  Daughter   Disposition Plan:      To home once workup is complete and patient is stable                    Would benefit from PT/OT eval prior to DC  Ordered                    Consults called: none  Admission status:      Obs    Level of care    tele  For 12H   Oviya Ammar 04/02/2018, 12:56 AM     [facility]  Pager [phone]   after 2 AM please page floor coverage PA If 7AM-7PM, please contact the day team taking care of the patient  [email or web address]  Password [email or web address]

## 2018-04-01 NOTE — ED Provider Notes (Signed)
[facility] DEPT Provider Note   CSN: [id number] Arrival date & time: 04/01/18  2106     History   Chief Complaint Chief Complaint  Patient presents with  . Chest Pain    HPI Janet [name] is a 84 y.o. female.  HPI Patient presents to the emergency department with shortness of breath and an episode of chest discomfort that was earlier today with very short nature.  Patient states she had some nausea along with shortness of breath.  She states she had swelling in her bilateral lower extremities.  Patient states that nothing seems to make the condition better when she takes activity seems to make her more fatigued.  The patient denies headache,blurred vision, neck pain, fever, cough,  numbness, dizziness, anorexia, edema, abdominal pain, vomiting, diarrhea, rash, back pain, dysuria, hematemesis, bloody stool, near syncope, or syncope. Past Medical History:  Diagnosis Date  . Anemia   . Arthritis   . Asthma   . Diabetes mellitus    type 2  . Family history of adverse reaction to anesthesia    daughter has " a allergic reaction to anesthesia " does not know which one   . Gall stones   . Hypertension   . Kidney stones   . Thyroid disease     Patient Active Problem List   Diagnosis Date Noted  . Anemia 04/20/2017  . CKD (chronic kidney disease), stage V ([place]) 04/20/2017  . Kidney stones   . SBO (small bowel obstruction) ([place]) 06/27/2014  . Hypothyroidism 06/27/2014  . Benign essential HTN 06/27/2014  . DM type 2 (diabetes mellitus, type 2) ([place]) 06/27/2014  . Dilation of biliary tract 06/27/2014    Past Surgical History:  Procedure Laterality Date  . ABDOMINAL HYSTERECTOMY    . CATARACT EXTRACTION Bilateral   . CHOLECYSTECTOMY    . HERNIA REPAIR     open     OB History   None      Home Medications    Prior to Admission medications   Medication Sig Start Date End Date Taking? Authorizing Provider  amLODipine (NORVASC) 5 MG  tablet Take 1 tablet (5 mg total) by mouth daily. 04/26/17   Rutherford Guys, MD  carvedilol (COREG) 3.125 MG tablet Take 1 tablet (3.125 mg total) by mouth 2 (two) times daily with a meal. 04/26/17   Rutherford Guys, MD  Multiple Vitamins-Minerals (CENTRUM ADULTS PO) Take by mouth.    [provider]  OVER THE COUNTER MEDICATION Take 1 tablet by mouth daily. Diabetes Management Herbal Supplement    [provider]  SYNTHROID 50 MCG tablet Take 1 tablet (50 mcg total) by mouth daily before breakfast. 04/26/17   Rutherford Guys, MD  Tetrahydrozoline HCl (VISINE OP) Place 1-2 drops into both eyes daily as needed (watery eyes).    [provider]    Family History Family History  Problem Relation Age of Onset  . Heart attack Mother   . Cancer Brother   . Diabetes Other     Social History Social History   Tobacco Use  . Smoking status: Never Smoker  . Smokeless tobacco: Never Used  Substance Use Topics  . Alcohol use: No  . Drug use: No     Allergies   Other   Review of Systems Review of Systems All other systems negative except as documented in the HPI. All pertinent positives and negatives as reviewed in the HPI.  Physical Exam Updated Vital Signs BP Marland Kitchen)  206/66   Pulse 76   Temp 98 F (36.7 C) (Oral)   Resp 13   SpO2 97%   Physical Exam  Constitutional: She is oriented to person, place, and time. She appears well-developed and well-nourished. No distress.  HENT:  Head: Normocephalic and atraumatic.  Mouth/Throat: Oropharynx is clear and moist.  Eyes: Pupils are equal, round, and reactive to light.  Neck: Normal range of motion. Neck supple.  Cardiovascular: Normal rate, regular rhythm and normal heart sounds. Exam reveals no gallop and no friction rub.  No murmur heard. Pulmonary/Chest: Effort normal and breath sounds normal. No respiratory distress. She has no wheezes.  Abdominal: Soft. Bowel sounds are normal. She exhibits no  distension. There is no tenderness.  Neurological: She is alert and oriented to person, place, and time. She exhibits normal muscle tone. Coordination normal.  Skin: Skin is warm and dry. Capillary refill takes less than 2 seconds. No rash noted. No erythema.  Psychiatric: She has a normal mood and affect. Her behavior is normal.  Nursing note and vitals reviewed.    ED Treatments / Results  Labs (all labs ordered are listed, but only abnormal results are displayed) Labs Reviewed  BASIC METABOLIC PANEL - Abnormal; Notable for the following components:      Result Value   Glucose, Bld 129 (*)    BUN 60 (*)    Creatinine, Ser 4.76 (*)    Calcium 8.5 (*)    GFR calc non Af Amer 7 (*)    GFR calc Af Amer 8 (*)    All other components within normal limits  CBC - Abnormal; Notable for the following components:   RBC 2.10 (*)    Hemoglobin 6.4 (*)    HCT 20.2 (*)    RDW 16.8 (*)    Platelets 532 (*)    All other components within normal limits  BRAIN NATRIURETIC PEPTIDE  I-STAT TROPONIN, ED    EKG EKG Interpretation  Date/Time:  Saturday April 01 2018 21:16:08 EDT Ventricular Rate:  82 PR Interval:    QRS Duration: 85 QT Interval:  376 QTC Calculation: 440 R Axis:   -50 Text Interpretation:  Sinus rhythm LAD, consider left anterior fascicular block Abnormal R-wave progression, late transition Baseline wander in lead(s) V1 Confirmed by Fredia Sorrow [phone]) on 04/01/2018 10:49:34 PM   Radiology Dg Chest 2 View  Result Date: 04/01/2018 CLINICAL DATA:  Left side chest pain EXAM: CHEST - 2 VIEW COMPARISON:  07/10/2017 FINDINGS: Cardiomegaly. No overt edema. No confluent opacities or effusions. No acute bony abnormality. IMPRESSION: Cardiomegaly.  No active disease. Electronically Signed   By: Rolm Baptise M.D.   On: 04/01/2018 21:55    Procedures Procedures (including critical care time)  Medications Ordered in ED Medications  0.9 %  sodium chloride infusion (has no  administration in time range)     Initial Impression / Assessment and Plan / ED Course  I have reviewed the triage vital signs and the nursing notes.  Pertinent labs & imaging results that were available during my care of the patient were reviewed by me and considered in my medical decision making (see chart for details).     CRITICAL CARE Performed by: Resa Miner Rollyn Scialdone Total critical care time:30 minutes Critical care time was exclusive of separately billable procedures and treating other patients. Critical care was necessary to treat or prevent imminent or life-threatening deterioration. Critical care was time spent personally by me on the following activities: development of treatment  plan with patient and/or surrogate as well as nursing, discussions with consultants, evaluation of patient's response to treatment, examination of patient, obtaining history from patient or surrogate, ordering and performing treatments and interventions, ordering and review of laboratory studies, ordering and review of radiographic studies, pulse oximetry and re-evaluation of patient's condition.  Patient will need blood transfusion for symptomatic anemia.  She does have worsening renal failure as well.  Patient will be admitted to the hospital.  I will speak to the [facility] about admission. Final Clinical Impressions(s) / ED Diagnoses   Final diagnoses:  None    ED Discharge Orders    None       Dalia Heading, PA-C 04/01/18 2326    Fredia Sorrow, MD 04/01/18 0211    Fredia Sorrow, MD 04/01/18 [phone]

## 2018-04-01 NOTE — ED Notes (Signed)
Date and time results received: 04/01/18 2244 (use smartphrase ".now" to insert current time)  Test: hemoglobin Critical Value 6.4  Name of Provider Notified: lawyer PA  Orders Received? Or Actions Taken?: waiting on orders

## 2018-04-01 NOTE — ED Triage Notes (Addendum)
Pt reports L sided chest pain today. She also endorses N/V/SOB and bilateral leg swelling. A&Ox4. Endorses HTN, diabetes, and anemia. Denies cardiac hx.

## 2018-04-02 ENCOUNTER — Encounter (HOSPITAL_COMMUNITY): Payer: Self-pay | Admitting: Internal Medicine

## 2018-04-02 ENCOUNTER — Other Ambulatory Visit: Payer: Self-pay

## 2018-04-02 DIAGNOSIS — I5032 Chronic diastolic (congestive) heart failure: Secondary | ICD-10-CM | POA: Diagnosis not present

## 2018-04-02 DIAGNOSIS — R5381 Other malaise: Secondary | ICD-10-CM | POA: Diagnosis present

## 2018-04-02 DIAGNOSIS — I10 Essential (primary) hypertension: Secondary | ICD-10-CM | POA: Diagnosis not present

## 2018-04-02 DIAGNOSIS — R079 Chest pain, unspecified: Secondary | ICD-10-CM | POA: Diagnosis not present

## 2018-04-02 DIAGNOSIS — D649 Anemia, unspecified: Secondary | ICD-10-CM | POA: Diagnosis not present

## 2018-04-02 DIAGNOSIS — N185 Chronic kidney disease, stage 5: Secondary | ICD-10-CM | POA: Diagnosis not present

## 2018-04-02 LAB — RETICULOCYTES
RBC.: 2.3 MIL/uL — AB (ref 3.87–5.11)
RETIC COUNT ABSOLUTE: 36.8 10*3/uL (ref 19.0–186.0)
Retic Ct Pct: 1.6 % (ref 0.4–3.1)

## 2018-04-02 LAB — CBC
HCT: 31.2 % — ABNORMAL LOW (ref 36.0–46.0)
Hemoglobin: 10.3 g/dL — ABNORMAL LOW (ref 12.0–15.0)
MCH: 30.1 pg (ref 26.0–34.0)
MCHC: 33 g/dL (ref 30.0–36.0)
MCV: 91.2 fL (ref 78.0–100.0)
PLATELETS: 524 10*3/uL — AB (ref 150–400)
RBC: 3.42 MIL/uL — ABNORMAL LOW (ref 3.87–5.11)
RDW: 16.5 % — AB (ref 11.5–15.5)
WBC: 8.3 10*3/uL (ref 4.0–10.5)

## 2018-04-02 LAB — FOLATE: Folate: 71.5 ng/mL (ref >5.9)

## 2018-04-02 LAB — TROPONIN I
TROPONIN I: 0.05 ng/mL — AB (ref <0.03)
Troponin I: 0.03 ng/mL (ref <0.03)
Troponin I: 0.05 ng/mL (ref <0.03)

## 2018-04-02 LAB — COMPREHENSIVE METABOLIC PANEL
ALK PHOS: 63 U/L (ref 38–126)
ALT: 22 U/L (ref 0–44)
AST: 37 U/L (ref 15–41)
Albumin: 3.6 g/dL (ref 3.5–5.0)
Anion gap: 11 (ref 5–15)
BUN: 58 mg/dL — AB (ref 8–23)
CHLORIDE: 110 mmol/L (ref 98–111)
CO2: 23 mmol/L (ref 22–32)
CREATININE: 4.37 mg/dL — AB (ref 0.44–1.00)
Calcium: 8.8 mg/dL — ABNORMAL LOW (ref 8.9–10.3)
GFR calc Af Amer: 9 mL/min — ABNORMAL LOW (ref >60)
GFR, EST NON AFRICAN AMERICAN: 8 mL/min — AB (ref >60)
Glucose, Bld: 122 mg/dL — ABNORMAL HIGH (ref 70–99)
Potassium: 4.4 mmol/L (ref 3.5–5.1)
Sodium: 144 mmol/L (ref 135–145)
Total Bilirubin: 0.8 mg/dL (ref 0.3–1.2)
Total Protein: 6.7 g/dL (ref 6.5–8.1)

## 2018-04-02 LAB — IRON AND TIBC
Iron: 54 ug/dL (ref 28–170)
Saturation Ratios: 22 % (ref 10.4–31.8)
TIBC: 242 ug/dL — ABNORMAL LOW (ref 250–450)
UIBC: 188 ug/dL

## 2018-04-02 LAB — VITAMIN B12: Vitamin B-12: 658 pg/mL (ref 180–914)

## 2018-04-02 LAB — FERRITIN: Ferritin: 367 ng/mL — ABNORMAL HIGH (ref 11–307)

## 2018-04-02 LAB — TSH: TSH: 21.35 u[IU]/mL — AB (ref 0.350–4.500)

## 2018-04-02 LAB — MAGNESIUM: Magnesium: 2.3 mg/dL (ref 1.7–2.4)

## 2018-04-02 LAB — PHOSPHORUS: Phosphorus: 4.5 mg/dL (ref 2.5–4.6)

## 2018-04-02 LAB — PREPARE RBC (CROSSMATCH)

## 2018-04-02 MED ORDER — SODIUM CHLORIDE 0.9 % IV SOLN
250.0000 mL | INTRAVENOUS | Status: DC | PRN
  Administered 2018-04-02: 250 mL via INTRAVENOUS

## 2018-04-02 MED ORDER — LEVOTHYROXINE SODIUM 50 MCG PO TABS
50.0000 ug | ORAL_TABLET | Freq: Every day | ORAL | Status: DC
  Administered 2018-04-02 – 2018-04-03 (×2): 50 ug via ORAL
  Filled 2018-04-02 (×2): qty 1

## 2018-04-02 MED ORDER — FUROSEMIDE 10 MG/ML IJ SOLN
80.0000 mg | Freq: Once | INTRAMUSCULAR | Status: AC
  Administered 2018-04-02: 80 mg via INTRAVENOUS
  Filled 2018-04-02: qty 8

## 2018-04-02 MED ORDER — CARVEDILOL 12.5 MG PO TABS
12.5000 mg | ORAL_TABLET | Freq: Two times a day (BID) | ORAL | Status: DC
  Administered 2018-04-02 – 2018-04-03 (×3): 12.5 mg via ORAL
  Filled 2018-04-02 (×3): qty 1

## 2018-04-02 MED ORDER — HYDRALAZINE HCL 20 MG/ML IJ SOLN
10.0000 mg | Freq: Four times a day (QID) | INTRAMUSCULAR | Status: DC | PRN
  Administered 2018-04-02 – 2018-04-03 (×2): 10 mg via INTRAVENOUS
  Filled 2018-04-02 (×2): qty 1

## 2018-04-02 MED ORDER — ONDANSETRON HCL 4 MG/2ML IJ SOLN: 4.0000 mg | Freq: Four times a day (QID) | INTRAMUSCULAR | Status: DC | PRN

## 2018-04-02 MED ORDER — SODIUM CHLORIDE 0.9% FLUSH: 3.0000 mL | INTRAVENOUS | Status: DC | PRN

## 2018-04-02 MED ORDER — ACETAMINOPHEN 650 MG RE SUPP: 650.0000 mg | Freq: Four times a day (QID) | RECTAL | Status: DC | PRN

## 2018-04-02 MED ORDER — AMLODIPINE BESYLATE 5 MG PO TABS
5.0000 mg | ORAL_TABLET | Freq: Every day | ORAL | Status: DC
  Administered 2018-04-02: 5 mg via ORAL
  Filled 2018-04-02: qty 1

## 2018-04-02 MED ORDER — SODIUM CHLORIDE 0.9% IV SOLUTION: Freq: Once | INTRAVENOUS | Status: DC

## 2018-04-02 MED ORDER — AMLODIPINE BESYLATE 10 MG PO TABS
10.0000 mg | ORAL_TABLET | Freq: Every day | ORAL | Status: DC
  Administered 2018-04-03: 10 mg via ORAL
  Filled 2018-04-02: qty 1

## 2018-04-02 MED ORDER — CARVEDILOL 3.125 MG PO TABS
3.1250 mg | ORAL_TABLET | Freq: Two times a day (BID) | ORAL | Status: DC
  Administered 2018-04-02: 3.125 mg via ORAL
  Filled 2018-04-02: qty 1

## 2018-04-02 MED ORDER — ONDANSETRON HCL 4 MG PO TABS: 4.0000 mg | ORAL_TABLET | Freq: Four times a day (QID) | ORAL | Status: DC | PRN

## 2018-04-02 MED ORDER — ACETAMINOPHEN 325 MG PO TABS
650.0000 mg | ORAL_TABLET | Freq: Four times a day (QID) | ORAL | Status: DC | PRN
  Administered 2018-04-02 – 2018-04-03 (×2): 650 mg via ORAL
  Filled 2018-04-02 (×2): qty 2

## 2018-04-02 MED ORDER — SODIUM CHLORIDE 0.9 % IV SOLN
510.0000 mg | Freq: Once | INTRAVENOUS | Status: AC
  Administered 2018-04-02: 510 mg via INTRAVENOUS
  Filled 2018-04-02: qty 17

## 2018-04-02 MED ORDER — SODIUM CHLORIDE 0.9% FLUSH
3.0000 mL | Freq: Two times a day (BID) | INTRAVENOUS | Status: DC
  Administered 2018-04-02 – 2018-04-03 (×3): 3 mL via INTRAVENOUS

## 2018-04-02 MED ORDER — FUROSEMIDE 10 MG/ML IJ SOLN
40.0000 mg | Freq: Once | INTRAMUSCULAR | Status: AC
  Administered 2018-04-02: 40 mg via INTRAVENOUS
  Filled 2018-04-02: qty 4

## 2018-04-02 NOTE — Progress Notes (Addendum)
PROGRESS NOTE    Janet [name]  ZOX:[id number] DOB: Apr 19, 1924 DOA: 04/01/2018 PCP: Patient, No Pcp Per   Brief Narrative71 y.o. female with medical history significant of CKD,  diabetes, high blood pressure, anemia, gallstones, hypothyroidism    Presented with Shortness of breath, brief chest pain today for few minutes. Generalized fatigue.  Chest pain has been occasional.  Patient reports palpitations. no hx of Dark stools or blood in stools.  She feels like in the past when she had anemia. Last admission for the same 1 year ago in October 2018 ws felt to be due to anemia of chronic disease requiring 2 PRBC Hg initially 6.2 after 2 units 9.8 VItamin B 12 and Folate wnl hemoccult neg.  Of note patient has been in the past seen by nephrology and used to get shots for her anemia but stopped going because she was told that she may need dialysis and she did not want to go on dialysis.  So she has not been seen by nephrology for years.  Patient used to be taking Norvasc and Coreg but have not been able to do so because she states she is allergic to generics and her insurance would not cover brand names. Her blood pressure has been running elevated at home.  Had extensive discussion regarding goals of care. Patient has son who has disappeared over 15 years ago.  She is still waiting for him to return.  Patient would like to do anything possible to give her more time because she believes her son will come back.  She states that she would like to have cardiac work-up as needed if there is any cardiac issues.  But she was not interested in hemodialysis at this point explained to her importance of follow-up with nephrology for her long-standing history of anemia of chronic disease.  Overall medication adjustments to preserved kidney function  Regarding pertinent Chronic problems: during past admission in 2018  Last ECHO: grade 1 diastolic dysfunction Renal ultrasound shows medical renal  disease. While in ER: Cr up to 4.7 BNP 1084 Trop 0.03  Assessment & Plan:   Active Problems:   Hypothyroidism   Benign essential HTN   DM type 2 (diabetes mellitus, type 2) (HCC)   CKD (chronic kidney disease), stage V (HCC)   Symptomatic anemia   Debility   Chest pain   Chronic diastolic CHF (congestive heart failure) (HCC)  1 symptomatic anemia status post 2 units of blood transfusion.  Check FOBT.  Her hemoglobin upon admission was 6.4.  Since she was getting blood transfusion labs are not yet done today.  Follow-up CBC has been ordered.  Her iron level is low at 54 I will order Feraheme.  B12 level is 658 and folate is 71.5.  Follow-up labs from today.  2 uncontrolled hypertension patient does not take Norvasc or Coreg at home since insurance does not cover the brand names.INCREASE norvasc and coreg.  She is at very high risk for cardiac arrest  MI stroke or arrhythmias and even death.  Will need to have her blood pressure under control prior to discharge.  3 CKD stage V her creatinine was 4.76 upon admission,creatinine 4.37.  Potassium normal.patient is very non compliant to follow up with nephrologist.  4  chronic diastolic CHF with elevated BNP and bilateral crackles and peripheral edema.  She received Lasix between blood transfusion.  I will give another dose of Lasix now.  5 type 2 diabetes no meds at  home  6 hypothyroidism  continue synthroid    DVT prophylaxis:scd Code Status: full Family Communication none she lives alone Disposition Plan: Discharge hopefully in 24 to 48 hours once blood pressure has been controlled.  She must follow-up with nephrology which she has not been doing.  She must take her antihypertensives at home.  Consultants: [facility] renal on the phone.no new suggestions.reconsult if needed.  Procedures: Blood transfusion x2 Antimicrobials: None  Subjective: Sitting by the side of the bed has some dyspnea on exertion denies chest  pain   Objective: Vitals:   04/02/18 0700 04/02/18 0826 04/02/18 0900 04/02/18 1123  BP: (!) 190/78 (!) 197/78 (!) 218/77 (!) 214/96  Pulse:  66 71   Resp:   16   Temp:   97.9 F (36.6 C)   TempSrc:   Oral   SpO2:   97% 95%  Weight:      Height:        Intake/Output Summary (Last 24 hours) at 04/02/2018 1149 Last data filed at 04/02/2018 0900 Gross per 24 hour  Intake 906 ml  Output 1702 ml  Net -796 ml   Filed Weights   04/02/18 0200  Weight: 49.4 kg    Examination:  General exam: Appears calm and comfortable  Respiratory system: Crackles b/l auscultation. Respiratory effort normal. Cardiovascular system: S1 & S2 heard, RRR. No JVD, murmurs, rubs, gallops or clicks. No pedal edema. Gastrointestinal system: Abdomen is nondistended, soft and nontender. No organomegaly or masses felt. Normal bowel sounds heard. Central nervous system: Alert and oriented. No focal neurological deficits. Extremities: 2 plus edema Skin: No rashes, lesions or ulcers Psychiatry: Judgement and insight appear normal. Mood & affect appropriate.     Data Reviewed: I have personally reviewed following labs and imaging studies  CBC: Recent Labs  Lab 04/01/18 2220  WBC 7.5  HGB 6.4*  HCT 20.2*  MCV 96.2  PLT 379*   Basic Metabolic Panel: Recent Labs  Lab 04/01/18 2220  NA 141  K 4.6  CL 107  CO2 24  GLUCOSE 129*  BUN 60*  CREATININE 4.76*  CALCIUM 8.5*   GFR: Estimated Creatinine Clearance: 5.1 mL/min (A) (by C-G formula based on SCr of 4.76 mg/dL (H)). Liver Function Tests: No results for input(s): AST, ALT, ALKPHOS, BILITOT, PROT, ALBUMIN in the last 168 hours. No results for input(s): LIPASE, AMYLASE in the last 168 hours. No results for input(s): AMMONIA in the last 168 hours. Coagulation Profile: No results for input(s): INR, PROTIME in the last 168 hours. Cardiac Enzymes: Recent Labs  Lab 04/01/18 2349  TROPONINI 0.03*   BNP (last 3 results) No results for  input(s): PROBNP in the last 8760 hours. HbA1C: No results for input(s): HGBA1C in the last 72 hours. CBG: No results for input(s): GLUCAP in the last 168 hours. Lipid Profile: No results for input(s): CHOL, HDL, LDLCALC, TRIG, CHOLHDL, LDLDIRECT in the last 72 hours. Thyroid Function Tests: No results for input(s): TSH, T4TOTAL, FREET4, T3FREE, THYROIDAB in the last 72 hours. Anemia Panel: Recent Labs    04/01/18 2220 04/01/18 2349  VITAMINB12 658  --   FOLATE  --  71.5  FERRITIN 367*  --   TIBC 242*  --   IRON 54  --   RETICCTPCT  --  1.6   Sepsis Labs: No results for input(s): PROCALCITON, LATICACIDVEN in the last 168 hours.  No results found for this or any previous visit (from the past 240 hour(s)).       Radiology Studies: Dg Chest  2 View  Result Date: 04/01/2018 CLINICAL DATA:  Left side chest pain EXAM: CHEST - 2 VIEW COMPARISON:  07/10/2017 FINDINGS: Cardiomegaly. No overt edema. No confluent opacities or effusions. No acute bony abnormality. IMPRESSION: Cardiomegaly.  No active disease. Electronically Signed   By: Rolm Baptise M.D.   On: 04/01/2018 21:55        Scheduled Meds: . sodium chloride   Intravenous Once  . amLODipine  5 mg Oral Daily  . carvedilol  3.125 mg Oral BID WC  . levothyroxine  50 mcg Oral QAC breakfast  . sodium chloride flush  3 mL Intravenous Q12H   Continuous Infusions: . sodium chloride    . sodium chloride 250 mL (04/02/18 0236)     LOS: 0 days     Georgette Shell, MD  If 7PM-7AM, please contact night-coverage [email or web address] Password [facility] 04/02/2018, 11:49 AM

## 2018-04-02 NOTE — Progress Notes (Signed)
Pt ambulated 15 feet and c/o increased SOB, sat assessed on ra 90%. Will f/u.

## 2018-04-02 NOTE — Progress Notes (Signed)
Call placed to provider regarding patients BP 180/64. PRN Hydralazine 10 mg IV every 6 hours for SBP > 160.

## 2018-04-02 NOTE — Evaluation (Signed)
Physical Therapy Evaluation Patient Details Name: Janet [name] MRN: [id number] DOB: 02-11-1924 Today's Date: 04/02/2018   History of Present Illness  84 y.o. female with medical history significant of CKD,  diabetes, high blood pressure, anemia, gallstones, hypothyroidism admitted with symptomatic anemia.   Clinical Impression  Pt admitted with above diagnosis. Pt currently with functional limitations due to the deficits listed below (see PT Problem List). Pt ambulated 10' with min hand held assist, distance limited by therapist 2* BP 214/96. Pt is expected to progress well enough to return home where she ambulates independently with a cane and performs ADLs independently.  Pt will benefit from skilled PT to increase their independence and safety with mobility to allow discharge to the venue listed below.       Follow Up Recommendations Home health PT;Supervision for mobility/OOB    Equipment Recommendations  None recommended by PT    Recommendations for Other Services       Precautions / Restrictions Precautions Precautions: Fall Precaution Comments: monitor BP Restrictions Weight Bearing Restrictions: No      Mobility  Bed Mobility               General bed mobility comments: up on bedside commode  Transfers Overall transfer level: Needs assistance Equipment used: 1 person hand held assist Transfers: Sit to/from Stand Sit to Stand: Min guard         General transfer comment: min/guard for safety, no physical assist needed  Ambulation/Gait Ambulation/Gait assistance: Min assist Gait Distance (Feet): 10 Feet Assistive device: 1 person hand held assist Gait Pattern/deviations: Step-through pattern;Decreased stride length;Trunk flexed Gait velocity: WFL   General Gait Details: steady, no loss of balance, SaO2 95% on room air, BP 214/96 so did not ambulate further  Stairs            Wheelchair Mobility    Modified Rankin (Stroke Patients Only)        Balance Overall balance assessment: Needs assistance   Sitting balance-Leahy Scale: Good       Standing balance-Leahy Scale: Fair                               Pertinent Vitals/Pain Pain Assessment: No/denies pain    Home Living Family/patient expects to be discharged to:: Private residence   Available Help at Discharge: Family;Available PRN/intermittently Type of Home: Apartment Home Access: Stairs to enter Entrance Stairs-Rails: None   Home Layout: One level Home Equipment: Environmental consultant - 2 wheels;Cane - single point      Prior Function Level of Independence: Independent with assistive device(s)         Comments: walks with cane, independent ADLs     Hand Dominance        Extremity/Trunk Assessment   Upper Extremity Assessment Upper Extremity Assessment: Overall WFL for tasks assessed    Lower Extremity Assessment Lower Extremity Assessment: Overall WFL for tasks assessed    Cervical / Trunk Assessment Cervical / Trunk Assessment: Kyphotic  Communication      Cognition Arousal/Alertness: Awake/alert Behavior During Therapy: WFL for tasks assessed/performed Overall Cognitive Status: Within Functional Limits for tasks assessed                                        General Comments      Exercises     Assessment/Plan  PT Assessment Patient needs continued PT services  PT Problem List Decreased mobility;Cardiopulmonary status limiting activity       PT Treatment Interventions DME instruction;Gait training;Therapeutic activities;Therapeutic exercise;Functional mobility training;Patient/family education    PT Goals (Current goals can be found in the Care Plan section)  Acute Rehab PT Goals Patient Stated Goal: return home PT Goal Formulation: With patient Time For Goal Achievement: 04/16/18 Potential to Achieve Goals: Good    Frequency Min 3X/week   Barriers to discharge        Co-evaluation                AM-PAC PT "6 Clicks" Daily Activity  Outcome Measure Difficulty turning over in bed (including adjusting bedclothes, sheets and blankets)?: A Little Difficulty moving from lying on back to sitting on the side of the bed? : A Little Difficulty sitting down on and standing up from a chair with arms (e.g., wheelchair, bedside commode, etc,.)?: A Little Help needed moving to and from a bed to chair (including a wheelchair)?: A Little Help needed walking in hospital room?: A Little Help needed climbing 3-5 steps with a railing? : A Lot 6 Click Score: 17    End of Session Equipment Utilized During Treatment: Gait belt Activity Tolerance: Patient tolerated treatment well Patient left: in chair;with call bell/phone within reach;Other (comment)(phlebotomist in room) Nurse Communication: Mobility status PT Visit Diagnosis: Difficulty in walking, not elsewhere classified (R26.2)    Time: 2836-6294 PT Time Calculation (min) (ACUTE ONLY): 20 min   Charges:   PT Evaluation $PT Eval Low Complexity: 1 Low          Blondell Reveal Kistler PT 04/02/2018  Acute Rehabilitation Services Pager [phone] Office [phone]

## 2018-04-02 NOTE — Progress Notes (Signed)
ED TO INPATIENT HANDOFF REPORT  Name/Age/Gender Janet [name] 84 y.o. female  Code Status Code Status History    Date Active Date Inactive Code Status Order ID Comments User Context   04/20/2017 2039 04/22/2017 2143 DNR [id number]  Elodia Florence., MD Inpatient   04/20/2017 1734 04/20/2017 2039 Full Code [id number]  Elodia Florence., MD Inpatient   06/27/2014 1014 06/28/2014 1532 Full Code [id number]  Jonetta Osgood, MD Inpatient    Questions for Most Recent Historical Code Status (Order [id number])    Question Answer Comment   In the event of cardiac or respiratory ARREST Do not call a "code blue"    In the event of cardiac or respiratory ARREST Do not perform Intubation, CPR, defibrillation or ACLS    In the event of cardiac or respiratory ARREST Use medication by any route, position, wound care, and other measures to relive pain and suffering. May use oxygen, suction and manual treatment of airway obstruction as needed for comfort.       Home/SNF/Other Home  Chief Complaint Chest Pain   Level of Care/Admitting Diagnosis ED Disposition    ED Disposition Condition Comment   Admit  Hospital Area: [facility] [[id number]]  Level of Care: Telemetry [5]  Admit to tele based on following criteria: Monitor for Ischemic changes  Diagnosis: Symptomatic anemia [9924268]  Admitting Physician: Toy Baker [[id number]]  Attending Physician: Toy Baker [[id number]]  PT Class (Do Not Modify): Observation [104]  PT Acc Code (Do Not Modify): Observation [10022]       Medical History Past Medical History:  Diagnosis Date  . Anemia   . Arthritis   . Asthma   . Diabetes mellitus    type 2  . Family history of adverse reaction to anesthesia    daughter has " a allergic reaction to anesthesia " does not know which one   . Gall stones   . Hypertension   . Kidney stones   . Thyroid disease     Allergies Allergies  Allergen Reactions  . Other      Some generic medication, can only take most brand name medications    IV Location/Drains/Wounds Patient Lines/Drains/Airways Status   Active Line/Drains/Airways    None          Labs/Imaging Results for orders placed or performed during the hospital encounter of 04/01/18 (from the past 48 hour(s))  Basic metabolic panel     Status: Abnormal   Collection Time: 04/01/18 10:20 PM  Result Value Ref Range   Sodium 141 135 - 145 mmol/L   Potassium 4.6 3.5 - 5.1 mmol/L   Chloride 107 98 - 111 mmol/L   CO2 24 22 - 32 mmol/L   Glucose, Bld 129 (H) 70 - 99 mg/dL   BUN 60 (H) 8 - 23 mg/dL   Creatinine, Ser 4.76 (H) 0.44 - 1.00 mg/dL   Calcium 8.5 (L) 8.9 - 10.3 mg/dL   GFR calc non Af Amer 7 (L) >60 mL/min   GFR calc Af Amer 8 (L) >60 mL/min    Comment: (NOTE) The eGFR has been calculated using the CKD EPI equation. This calculation has not been validated in all clinical situations. eGFR's persistently <60 mL/min signify possible Chronic Kidney Disease.    Anion gap 10 5 - 15    Comment: Performed at [facility], [place] [address] [place], [place] [address]  CBC     Status: Abnormal   Collection Time: 04/01/18 10:20  PM  Result Value Ref Range   WBC 7.5 4.0 - 10.5 K/uL   RBC 2.10 (L) 3.87 - 5.11 MIL/uL   Hemoglobin 6.4 (LL) 12.0 - 15.0 g/dL    Comment: REPEATED TO VERIFY CRITICAL RESULT CALLED TO, READ BACK BY AND VERIFIED WITH: J,NASH AT 2245 ON 04/01/18 BY A,MOHAMED    HCT 20.2 (L) 36.0 - 46.0 %   MCV 96.2 78.0 - 100.0 fL   MCH 30.5 26.0 - 34.0 pg   MCHC 31.7 30.0 - 36.0 g/dL   RDW 16.8 (H) 11.5 - 15.5 %   Platelets 532 (H) 150 - 400 K/uL    Comment: Performed at [facility], [place] [address] [place], [place] [address]  I-stat troponin, ED     Status: None   Collection Time: 04/01/18 10:30 PM  Result Value Ref Range   Troponin i, poc 0.03 0.00 - 0.08 ng/mL   Comment 3            Comment: Due to the release kinetics of cTnI, a  negative result within the first hours of the onset of symptoms does not rule out myocardial infarction with certainty. If myocardial infarction is still suspected, repeat the test at appropriate intervals.   Brain natriuretic peptide     Status: Abnormal   Collection Time: 04/01/18 10:33 PM  Result Value Ref Range   B Natriuretic Peptide 1,084.1 (H) 0.0 - 100.0 pg/mL    Comment: Performed at [facility], [place] [address] [place], [place] [address]   Dg Chest 2 View  Result Date: 04/01/2018 CLINICAL DATA:  Left side chest pain EXAM: CHEST - 2 VIEW COMPARISON:  07/10/2017 FINDINGS: Cardiomegaly. No overt edema. No confluent opacities or effusions. No acute bony abnormality. IMPRESSION: Cardiomegaly.  No active disease. Electronically Signed   By: Rolm Baptise M.D.   On: 04/01/2018 21:55    Pending Labs Unresulted Labs (From admission, onward)    Start     Ordered   04/01/18 2349  Vitamin B12  (Anemia Panel (PNL))  STAT,   STAT     04/01/18 2348   04/01/18 2349  Folate  (Anemia Panel (PNL))  STAT,   STAT     04/01/18 2348   04/01/18 2349  Iron and TIBC  (Anemia Panel (PNL))  STAT,   STAT     04/01/18 2348   04/01/18 2349  Ferritin  (Anemia Panel (PNL))  STAT,   STAT     04/01/18 2348   04/01/18 2349  Reticulocytes  (Anemia Panel (PNL))  STAT,   STAT     04/01/18 2348   04/01/18 2347  Type and screen  Once,   STAT     04/01/18 2346   04/01/18 2324  Prepare RBC  (Adult Blood Administration - PRBC)  Once,   R    Question Answer Comment  # of Units 2 units   Transfusion Indications Symptomatic Anemia   If emergent release call blood bank Not emergent release      04/01/18 2323   Signed and Held  Magnesium  Tomorrow morning,   R    Comments:  Call MD if <1.5    Signed and Held   Signed and Held  Phosphorus  Tomorrow morning,   R     Signed and Held   Signed and Held  TSH  Once,   R    Comments:  Cancel if already done within 1 month and notify MD  Signed  and Held   Signed and Held  Comprehensive metabolic panel  Once,   R    Comments:  Cal MD for K<3.5 or >5.0    Signed and Held   Signed and Held  CBC  Once,   R    Comments:  Call for hg <8.0    Signed and Held   Signed and Held  Troponin I  Now then every 6 hours,   R     Signed and Held   Signed and Held  Occult blood card to lab, stool RN will collect  As needed,   R    Question:  Specimen to be collected by?  Answer:  RN will collect   Signed and Held          Vitals/Pain Today's Vitals   04/02/18 0002 04/02/18 0020 04/02/18 0040 04/02/18 0052  BP: (!) 211/81 (!) 194/84 (!) 193/65   Pulse: 87 85 78   Resp: 20 (!) 21 19   Temp:      TempSrc:      SpO2: 95% 93% 94%   PainSc:    2     Isolation Precautions No active isolations  Medications Medications  0.9 %  sodium chloride infusion (has no administration in time range)    Mobility walks with device

## 2018-04-03 DIAGNOSIS — E039 Hypothyroidism, unspecified: Secondary | ICD-10-CM | POA: Diagnosis not present

## 2018-04-03 DIAGNOSIS — E1122 Type 2 diabetes mellitus with diabetic chronic kidney disease: Secondary | ICD-10-CM

## 2018-04-03 DIAGNOSIS — D649 Anemia, unspecified: Secondary | ICD-10-CM | POA: Diagnosis not present

## 2018-04-03 DIAGNOSIS — R5381 Other malaise: Secondary | ICD-10-CM | POA: Diagnosis not present

## 2018-04-03 DIAGNOSIS — I10 Essential (primary) hypertension: Secondary | ICD-10-CM | POA: Diagnosis not present

## 2018-04-03 DIAGNOSIS — N185 Chronic kidney disease, stage 5: Secondary | ICD-10-CM | POA: Diagnosis not present

## 2018-04-03 DIAGNOSIS — N179 Acute kidney failure, unspecified: Secondary | ICD-10-CM | POA: Diagnosis not present

## 2018-04-03 DIAGNOSIS — I5032 Chronic diastolic (congestive) heart failure: Secondary | ICD-10-CM | POA: Diagnosis not present

## 2018-04-03 LAB — BPAM RBC
Blood Product Expiration Date: 201910202359
Blood Product Expiration Date: 201910202359
ISSUE DATE / TIME: 201909220250
ISSUE DATE / TIME: 201909220542
Unit Type and Rh: 5100
Unit Type and Rh: 5100

## 2018-04-03 LAB — BASIC METABOLIC PANEL
ANION GAP: 11 (ref 5–15)
BUN: 57 mg/dL — ABNORMAL HIGH (ref 8–23)
CHLORIDE: 108 mmol/L (ref 98–111)
CO2: 23 mmol/L (ref 22–32)
Calcium: 8.7 mg/dL — ABNORMAL LOW (ref 8.9–10.3)
Creatinine, Ser: 4.75 mg/dL — ABNORMAL HIGH (ref 0.44–1.00)
GFR calc Af Amer: 8 mL/min — ABNORMAL LOW (ref >60)
GFR, EST NON AFRICAN AMERICAN: 7 mL/min — AB (ref >60)
Glucose, Bld: 93 mg/dL (ref 70–99)
POTASSIUM: 4.1 mmol/L (ref 3.5–5.1)
Sodium: 142 mmol/L (ref 135–145)

## 2018-04-03 LAB — TYPE AND SCREEN
ABO/RH(D): O POS
Antibody Screen: NEGATIVE
Unit division: 0
Unit division: 0

## 2018-04-03 LAB — CBC
HCT: 28.8 % — ABNORMAL LOW (ref 36.0–46.0)
HEMOGLOBIN: 9.6 g/dL — AB (ref 12.0–15.0)
MCH: 30.4 pg (ref 26.0–34.0)
MCHC: 33.3 g/dL (ref 30.0–36.0)
MCV: 91.1 fL (ref 78.0–100.0)
PLATELETS: 453 10*3/uL — AB (ref 150–400)
RBC: 3.16 MIL/uL — AB (ref 3.87–5.11)
RDW: 16.6 % — ABNORMAL HIGH (ref 11.5–15.5)
WBC: 7.3 10*3/uL (ref 4.0–10.5)

## 2018-04-03 MED ORDER — LEVOTHYROXINE SODIUM 75 MCG PO TABS: 75.0000 ug | ORAL_TABLET | Freq: Every day | ORAL | 0 refills | Status: DC

## 2018-04-03 MED ORDER — LISINOPRIL 2.5 MG PO TABS: 2.5000 mg | ORAL_TABLET | Freq: Every day | ORAL | 0 refills | Status: DC

## 2018-04-03 MED ORDER — LISINOPRIL 5 MG PO TABS
2.5000 mg | ORAL_TABLET | Freq: Every day | ORAL | Status: DC
  Administered 2018-04-03: 2.5 mg via ORAL
  Filled 2018-04-03: qty 1

## 2018-04-03 MED ORDER — AMLODIPINE BESYLATE 10 MG PO TABS: 10.0000 mg | ORAL_TABLET | Freq: Every day | ORAL | 0 refills | Status: DC

## 2018-04-03 MED ORDER — CARVEDILOL 12.5 MG PO TABS: 12.5000 mg | ORAL_TABLET | Freq: Two times a day (BID) | ORAL | 0 refills | Status: DC

## 2018-04-03 NOTE — Discharge Summary (Addendum)
Physician Discharge Summary  Janet [name]  GYK:[id number]  DOB: October 06, 1923  DOA: 04/01/2018 PCP: Patient, No Pcp Per  Admit date: 04/01/2018 Discharge date: 04/03/2018  Admitted From: Home  Disposition: Home   Recommendations for Outpatient Follow-up:  1. Follow up with PCP in 1 weeks 2. Please obtain BMP/CBC in one week to monitor Hgb and renal function  3. Follow-up with nephrology as soon as possible. 4. Discussed goals of cares and CODE STATUS  Home Health: PT/RN/Aide   Discharge Condition: Stable CODE STATUS: Full code Diet recommendation: Heart Healthy   Brief/Interim Summary: For full details see H&P/Progress note, but in brief, Janet [name] is a 84 year old female with medical history significant for CKD stage III, not on dialysis, diabetes mellitus type 2, hypertension, anemia of chronic kidney disease, hypothyroidism who is noncompliant with medication.  Patient presented to the emergency department complaining of shortness of breath, weakness and chest pain.  ED evaluation hemoglobin was 6.4, Creatinine was 4.7, troponin 0 0.03, BNP 1084, blood pressure severely elevated in the 200's.  Patient was admitted with working diagnosis of symptomatic anemia and hypertensive urgency.  Patient was transfused 2 units of PRBCs, and started on her blood pressure medication.  Subsequently blood pressure improved from hemoglobin remained stable.  Patient clinically improved and was deemed stable for discharge to follow-up as an outpatient.  Subjective: Patient seen and examined, has no complaints this morning.  She reported have allergies to generic medications, however she is currently receiving amlodipine and carvedilol in the hospital with no side effects.  I have discussed case with daughter and patient she seems to be noncompliant with follow-ups.  Prior she was receiving Aranesp for anemia of chronic kidney disease, she was feeling well however patient decided to stop getting the  injections.  Discharge Diagnoses/Hospital Course:  Symptomatic anemia from chronic plus anemia due to anemia of chronic kidney disease Patient was transfused 2 units of PRBCs.  Patient used to receive Aranesp by nephrology however patient decided to stop treatment due to fear of hemodialysis.  Will continue iron supplement.  I have discussed with patient and daughter that she should continue to follow-up with nephrologist for Aranesp injections.  Patient receive 1 dose of Feraheme during hospital stay.  Hemoglobin upon discharge 9.6.  Hypertensive urgency Felt to be the cause of chest pain and shortness of breath.  Initial blood pressures with systolic above 793.  Patient not compliant with medications due to "allergies to generic medication " "patient reported that her insurance does not want to pay for brand name medication "I have explained that she is been receiving generic medications here and no adverse reactions had happened.  Adjustment on blood pressure medication as follow: Norvasc 10 mg daily, Coreg 12.5 mg BID, started on lisinopril 2.5 mg daily.  Blood pressure upon discharge stable.  Advised to follow-up with PCP for blood pressure monitoring and medication adjustment as necessary. Slight bump in troponin due to demand ischemia, EKG with no ischemic changes.   CKD stage V Last creatinine 3.28 on 07/15/2017. GFR of 14.  Seems that renal failure continues to progress, patient is not interested in hemodialysis.  Is advised to follow-up with nephrologist for a full replacement.  Will need to discuss goals of care with PCP.  Chronic diastolic CHF  Patient had elevated BNP with peripheral edema and crackles on admission she received IV Lasix during hospital stay, on day of discharge patient looks dry, no crackles on exam and no peripheral edema.  No signs  of acute exacerbation.  Hypothyroidism TSH significantly elevated, will increase Synthroid and check labs in 6 weeks.  Off note I have  discussed with patient importance of compliance with medications, she received generic medication while inpatient, with no significant side effects.  Discussed that she should be okay taking amlodipine and carvedilol.  Case was discussed with daughter she will encourage compliance with medication and follow-up with PCP and nephrologist.  All other chronic medical condition were stable during the hospitalization.  Patient was seen by physical therapy, recommending home health PT On the day of the discharge the patient's vitals were stable, and no other acute medical condition were reported by patient. the patient was felt safe to be discharge to home  Discharge Instructions  You were cared for by a hospitalist during your hospital stay. If you have any questions about your discharge medications or the care you received while you were in the hospital after you are discharged, you can call the unit and asked to speak with the hospitalist on call if the hospitalist that took care of you is not available. Once you are discharged, your primary care physician will handle any further medical issues. Please note that NO REFILLS for any discharge medications will be authorized once you are discharged, as it is imperative that you return to your primary care physician (or establish a relationship with a primary care physician if you do not have one) for your aftercare needs so that they can reassess your need for medications and monitor your lab values.  Discharge Instructions    (HEART FAILURE PATIENTS) Call MD:  Anytime you have any of the following symptoms: 1) 3 pound weight gain in 24 hours or 5 pounds in 1 week 2) shortness of breath, with or without a dry hacking cough 3) swelling in the hands, feet or stomach 4) if you have to sleep on extra pillows at night in order to breathe.   Complete by:  As directed    Call MD for:  difficulty breathing, headache or visual disturbances   Complete by:  As directed     Call MD for:  extreme fatigue   Complete by:  As directed    Call MD for:  hives   Complete by:  As directed    Call MD for:  persistant dizziness or light-headedness   Complete by:  As directed    Call MD for:  persistant nausea and vomiting   Complete by:  As directed    Call MD for:  redness, tenderness, or signs of infection (pain, swelling, redness, odor or green/yellow discharge around incision site)   Complete by:  As directed    Call MD for:  severe uncontrolled pain   Complete by:  As directed    Call MD for:  temperature >100.4   Complete by:  As directed    Diet - low sodium heart healthy   Complete by:  As directed    Increase activity slowly   Complete by:  As directed      Allergies as of 04/03/2018      Reactions   Other    Some generic medication, can only take most brand name medications      Medication List    STOP taking these medications   OVER THE COUNTER MEDICATION     TAKE these medications   amLODipine 10 MG tablet Commonly known as:  NORVASC Take 1 tablet (10 mg total) by mouth daily. What changed:  medication strength  how much to take   carvedilol 12.5 MG tablet Commonly known as:  COREG Take 1 tablet (12.5 mg total) by mouth 2 (two) times daily with a meal. What changed:    medication strength  how much to take   CENTRUM ADULTS PO Take 1 tablet by mouth daily.   levothyroxine 75 MCG tablet Commonly known as:  SYNTHROID, LEVOTHROID Take 1 tablet (75 mcg total) by mouth daily. What changed:    medication strength  how much to take  when to take this   lisinopril 2.5 MG tablet Commonly known as:  PRINIVIL,ZESTRIL Take 1 tablet (2.5 mg total) by mouth daily. Start taking on:  04/04/2018   omega-3 acid ethyl esters 1 g capsule Commonly known as:  LOVAZA Take 1 g by mouth daily.   VISINE OP Place 1-2 drops into both eyes daily as needed (watery eyes).      Follow-up Information    Donato Heinz, MD. Schedule  an appointment as soon as possible for a visit in 1 week(s).   Specialty:  Nephrology Why:  Hospital follow up, progression of renal diseases  Contact information: [place] [address] [phone]        Nicholes Rough, PA-C. Schedule an appointment as soon as possible for a visit in 1 week(s).   Specialty:  Physician Assistant Why:  Hospital follow up  Contact information: [address] [place] [address] [phone]          Allergies  Allergen Reactions  . Other     Some generic medication, can only take most brand name medications    Consultations: None   Procedures/Studies: Dg Chest 2 View  Result Date: 04/01/2018 CLINICAL DATA:  Left side chest pain EXAM: CHEST - 2 VIEW COMPARISON:  07/10/2017 FINDINGS: Cardiomegaly. No overt edema. No confluent opacities or effusions. No acute bony abnormality. IMPRESSION: Cardiomegaly.  No active disease. Electronically Signed   By: Rolm Baptise M.D.   On: 04/01/2018 21:55   Discharge Exam: Vitals:   04/03/18 0441 04/03/18 0925  BP: (!) 172/66 (!) 131/52  Pulse: 66 62  Resp: 18   Temp: 98 F (36.7 C)   SpO2: 94%    Vitals:   04/02/18 2035 04/03/18 0039 04/03/18 0441 04/03/18 0925  BP: (!) 167/60 (!) 140/48 (!) 172/66 (!) 131/52  Pulse: (!) 57 60 66 62  Resp: 18 18 18    Temp: 98.3 F (36.8 C) 98 F (36.7 C) 98 F (36.7 C)   TempSrc: Oral Oral Oral   SpO2: 93% 96% 94%   Weight:      Height:        General: Pt is alert, awake, not in acute distress Cardiovascular: RRR, S1/S2 +, no rubs, no gallops Respiratory: CTA bilaterally, no wheezing, no rhonchi Abdominal: Soft, NT, ND, bowel sounds + Extremities: no edema, no cyanosis   The results of significant diagnostics from this hospitalization (including imaging, microbiology, ancillary and laboratory) are listed below for reference.     Microbiology: No results found for this or any previous visit (from the past 240 hour(s)).    Labs: BNP (last 3 results) Recent Labs    04/19/17 1702 04/20/17 1234 04/01/18 2233  BNP 401.3* 623.6* 1,975.8*   Basic Metabolic Panel: Recent Labs  Lab 04/01/18 2220 04/02/18 1126 04/03/18 0533  NA 141 144 142  K 4.6 4.4 4.1  CL 107 110 108  CO2 24 23 23   GLUCOSE 129* 122* 93  BUN 60* 58* 57*  CREATININE 4.76* 4.37* 4.75*  CALCIUM 8.5* 8.8* 8.7*  MG  --  2.3  --   PHOS  --  4.5  --    Liver Function Tests: Recent Labs  Lab 04/02/18 1126  AST 37  ALT 22  ALKPHOS 63  BILITOT 0.8  PROT 6.7  ALBUMIN 3.6   No results for input(s): LIPASE, AMYLASE in the last 168 hours. No results for input(s): AMMONIA in the last 168 hours. CBC: Recent Labs  Lab 04/01/18 2220 04/02/18 1126 04/03/18 0533  WBC 7.5 8.3 7.3  HGB 6.4* 10.3* 9.6*  HCT 20.2* 31.2* 28.8*  MCV 96.2 91.2 91.1  PLT 532* 524* 453*   Cardiac Enzymes: Recent Labs  Lab 04/01/18 2349 04/02/18 1126 04/02/18 1647  TROPONINI 0.03* 0.05* 0.05*   BNP: Invalid input(s): POCBNP CBG: No results for input(s): GLUCAP in the last 168 hours. D-Dimer No results for input(s): DDIMER in the last 72 hours. Hgb A1c No results for input(s): HGBA1C in the last 72 hours. Lipid Profile No results for input(s): CHOL, HDL, LDLCALC, TRIG, CHOLHDL, LDLDIRECT in the last 72 hours. Thyroid function studies Recent Labs    04/02/18 1126  TSH 21.350*   Anemia work up Recent Labs    04/01/18 2220 04/01/18 2349  VITAMINB12 658  --   FOLATE  --  71.5  FERRITIN 367*  --   TIBC 242*  --   IRON 54  --   RETICCTPCT  --  1.6   Urinalysis    Component Value Date/Time   COLORURINE YELLOW 04/20/2017 [place] 04/20/2017 1712   LABSPEC 1.010 04/20/2017 1712   PHURINE 6.0 04/20/2017 1712   GLUCOSEU 50 (A) 04/20/2017 1712   HGBUR NEGATIVE 04/20/2017 1712   BILIRUBINUR NEGATIVE 04/20/2017 1712   KETONESUR NEGATIVE 04/20/2017 1712   PROTEINUR >=300 (A) 04/20/2017 1712   UROBILINOGEN 0.2  06/27/2014 0525   NITRITE NEGATIVE 04/20/2017 1712   LEUKOCYTESUR NEGATIVE 04/20/2017 1712   Sepsis Labs Invalid input(s): PROCALCITONIN,  WBC,  LACTICIDVEN Microbiology No results found for this or any previous visit (from the past 240 hour(s)).   Time coordinating discharge: 32 minutes  SIGNED:  Chipper Oman, MD  [facility] 04/03/2018, 2:38 PM  Pager please text page via  [email or web address]  Note - This record has been created using [employer]. Chart creation errors have been sought, but may not always have been located. Such creation errors do not reflect on the standard of medical care.

## 2018-04-03 NOTE — Care Management Note (Signed)
Case Management Note  Patient Details  Name: Janet [name] MRN: [id number] Date of Birth: 1924/02/22  Subjective/Objective:Noted-OT added with [facility];also 3n1-[facility] rep Santiago Glad aware & will deliver 3n1 to rm prior d/c today. No further CM needs.                    Action/Plan:dC home w/HHC/dme   Expected Discharge Date:  04/03/18               Expected Discharge Plan:  [place]  In-House Referral:     Discharge planning Services  CM Consult  Post Acute Care Choice:  Durable Medical Equipment(cane, rw) Choice offered to:  Adult Children, Patient  DME Arranged:  3-N-1 DME Agency:  [place] Arranged:  RN, Disease Management, PT, Social Work, Nurse's Aide, OT [place] Agency:  [place]  Status of Service:  Completed, signed off  If discussed at [employer], dates discussed:    Additional Comments:  Dessa Phi, RN 04/03/2018, 2:46 PM

## 2018-04-03 NOTE — Care Management Obs Status (Signed)
[place] NOTIFICATION   Patient Details  Name: Janet [name] MRN: [id number] Date of Birth: Jan 24, 1924   Medicare Observation Status Notification Given:  Yes    MahabirJuliann Pulse, RN 04/03/2018, 2:15 PM

## 2018-04-03 NOTE — Care Management Note (Signed)
Case Management Note  Patient Details  Name: Janet [name] MRN: [id number] Date of Birth: 1923-08-14  Subjective/Objective: Admitted w/CKD, anemiz,HTN. From home alone. Has rw,cane. Used [facility] in past-patient/dtr agree to use again-[facility] rep Santiago Glad aware of HHRN/PT/aide,sw. Also cons for [facility]-disease mgmnt, meds mgmnt-patient states insurance not paying for brand name meds that she should take.Spoke to dtr per patient permission [phone]-she checks on patient daily,she agrees to [facility]. Dtr will transport home. No further CM needs.                   Action/Plan:d home w/HHC.   Expected Discharge Date:  04/03/18               Expected Discharge Plan:  [place]  In-House Referral:     Discharge planning Services  CM Consult  Post Acute Care Choice:  Durable Medical Equipment(cane, rw) Choice offered to:  Adult Children, Patient  DME Arranged:    DME Agency:     [facility] Arranged:  RN, Disease Management, PT, Social Work, Nurse's Aide [place] Agency:  [place]  Status of Service:  In process, will continue to follow  If discussed at Long Length of Stay Meetings, dates discussed:    Additional Comments:  Dessa Phi, RN 04/03/2018, 12:05 PM

## 2018-04-03 NOTE — Progress Notes (Addendum)
Occupational Therapy Evaluation Patient Details Name: Janet [name] MRN: [id number] DOB: 06-11-1924 Today's Date: 04/03/2018    History of present illness 84 y.o. female with medical history significant of CKD,  diabetes, high blood pressure, anemia, gallstones, hypothyroidism admitted with symptomatic anemia.    Clinical Impression  Pt admitted with the above. Pt currently with functional limitations due to the deficits listed below (see OT Problem List).  Pt will benefit from skilled OT to increase their safety and independence with ADL and functional mobility for ADL to facilitate discharge to venue listed below.    Follow Up Recommendations  Home health OT    Equipment Recommendations  3 in 1 bedside commode    Recommendations for Other Services      Precautions / Restrictions Precautions Precautions: Fall Precaution Comments: monitor BP       Mobility Bed Mobility Overal bed mobility: Needs Assistance Bed Mobility: Supine to Sit     Supine to sit: Min assist        Transfers Overall transfer level: Needs assistance Equipment used: Rolling walker (2 wheeled) Transfers: Sit to/from [facility] Sit to Stand: Min assist Stand pivot transfers: Min assist            Balance Overall balance assessment: Needs assistance   Sitting balance-Leahy Scale: Good       Standing balance-Leahy Scale: Fair                             ADL either performed or assessed with clinical judgement   ADL Overall ADL's : Needs assistance/impaired Eating/Feeding: Set up;Sitting   Grooming: Set up;Sitting   Upper Body Bathing: Set up;Sitting   Lower Body Bathing: Moderate assistance;Sit to/from stand;Cueing for sequencing;Cueing for safety   Upper Body Dressing : Minimal assistance;Sitting   Lower Body Dressing: Moderate assistance;Sit to/from stand;Cueing for sequencing;Cueing for safety   Toilet Transfer: Minimal  assistance;Stand-pivot;Cueing for sequencing;Cueing for safety   Toileting- Clothing Manipulation and Hygiene: Minimal assistance;Sit to/from stand;Cueing for sequencing;Cueing for safety               Vision Patient Visual Report: No change from baseline            Cognition Arousal/Alertness: Awake/alert Behavior During Therapy: WFL for tasks assessed/performed Overall Cognitive Status: Within Functional Limits for tasks assessed                                                     Pertinent Vitals/ Pain       Pain Assessment: No/denies pain  Home Living Family/patient expects to be discharged to:: Private residence   Available Help at Discharge: Family;Available PRN/intermittently Type of Home: Apartment Home Access: Stairs to enter   Entrance Stairs-Rails: None Home Layout: One level               Home Equipment: Environmental consultant - 2 wheels;Cane - single point          Prior Functioning/Environment Level of Independence: Independent with assistive device(s)        Comments: walks with cane, independent ADLs   Frequency  Min 2X/week        Progress Toward Goals  OT Goals(current goals can now be found in the care plan section)     Acute Rehab OT Goals Patient  Stated Goal: return home OT Goal Formulation: With patient Time For Goal Achievement: 04/10/18  Plan      Co-evaluation                 AM-PAC PT "6 Clicks" Daily Activity     Outcome Measure   Help from another person eating meals?: None Help from another person taking care of personal grooming?: A Little Help from another person toileting, which includes using toliet, bedpan, or urinal?: A Little Help from another person bathing (including washing, rinsing, drying)?: A Little Help from another person to put on and taking off regular upper body clothing?: A Little Help from another person to put on and taking off regular lower body clothing?: A Little 6 Click  Score: 19    End of Session    OT Visit Diagnosis: Unsteadiness on feet (R26.81);Muscle weakness (generalized) (M62.81);History of falling (Z91.81)   Activity Tolerance Patient tolerated treatment well   Patient Left in bed;with call bell/phone within reach;with bed alarm set   Nurse Communication          Time: 8088-1103 OT Time Calculation (min): 15 min  Charges: OT General Charges $OT Visit: 1 Visit OT Treatments $Self Care/Home Management : 8-22 mins  Kari Baars, [place] [phone] Office- [phone]      Janet [name], Edwena Felty D 04/03/2018, 2:24 PM

## 2018-04-03 NOTE — Progress Notes (Signed)
Per dtr pcp-[facility] [phone]. Per [facility] not a provider they follow-unable to utilize [facility] services.

## 2018-04-03 NOTE — Consult Note (Signed)
   [facility] CM Inpatient Consult   04/03/2018  Janne Faulk [birth year] [id number]    Referral received from inpatient RNCM for [place] Management program. Confirmed with inpatient RNCM that patient's Primary Care Provider is a PA with [place] (not a [facility] provider). Writer unable to identify any [facility] specialists that patient may be seeing either.  Made inpatient RNCM aware that patient not eligible for [facility] Care Management at this time.   Appreciate referral however.    Marthenia Rolling, MSN-Ed, RN,BSN [facility] Liaison [phone]

## 2018-04-03 NOTE — Discharge Instructions (Signed)
Food Basics for Chronic Kidney Disease  When your kidneys are not working well, they cannot remove waste and excess substances from your blood as effectively as they did before. This can lead to a buildup and imbalance of these substances, which can worsen kidney damage and affect how your body functions. Certain foods lead to a buildup of these substances in the body. By changing your diet as recommended by your diet and nutrition specialist (dietitian) or health care provider, you could help prevent further kidney damage and delay or prevent the need for dialysis.  What are tips for following this plan?  General instructions  · Work with your health care provider and dietitian to develop a meal plan that is right for you. Foods you can eat, limit, or avoid will be different for each person depending on the stage of kidney disease and any other existing health conditions.  · Talk with your health care provider about whether you should take a vitamin and mineral supplement.  · Use standard measuring cups and spoons to measure servings of foods. Use a kitchen scale to measure portions of protein foods.  · If directed by your health care provider, avoid drinking too much fluid. Measure and count all liquids, including water, ice, soups, flavored gelatin, and frozen desserts such as popsicles or ice cream.  Reading food labels  · Check the amount of sodium in foods. Choose foods that have less than 300 milligrams (mg) per serving.  · Check the ingredient list for phosphorus or potassium-based additives or preservatives.  · Check the amount of saturated and trans fat. Limit or avoid these fats as told by your dietitian.  Shopping  · Avoid buying foods that are:  ? Processed, frozen, or prepackaged.  ? Calcium-enriched or fortified.  · Do not buy foods that have salt or sodium listed among the first five ingredients.  · Do not buy canned vegetables.  Cooking   · Replace animal proteins, such as meat, fish, eggs, or dairy, with plant proteins from beans, nuts, and soy.  ? Use soy milk instead of cow's milk.  ? Add beans or tofu to soups, casseroles, or pasta dishes instead of meat.  · Soak vegetables, such as potatoes, before cooking to reduce potassium. To do this:  ? Peel and cut into small pieces.  ? Soak in warm water for at least 2 hours. For every 1 cup of vegetables, use 10 cups of water.  ? Drain and rinse with warm water.  ? Boil for at least 5 minutes.  Meal planning  · Limit the amount of protein from plant and animal sources you eat each day.  · Do not add salt to food when cooking or before eating.  · Eat meals and snacks at around the same time each day.  If you have diabetes:  · If you have diabetes (diabetes mellitus) and chronic kidney disease, it is important to keep your blood glucose in the target range recommended by your health care provider. Follow your diabetes management plan. This may include:  ? Checking your blood glucose regularly.  ? Taking oral medicines, insulin, or both.  ? Exercising for at least 30 minutes on 5 or more days each week, or as told by your health care provider.  ? Tracking how many servings of carbohydrates you eat at each meal.  · You may be given specific guidelines on how much of certain foods and nutrients you may eat, depending on your stage of   kidney disease and whether you have high blood pressure (hypertension). Follow your meal plan as told by your dietitian.  What nutrients should be limited?  The items listed are not a complete list. Talk with your dietitian about what dietary choices are best for you.  Potassium  Potassium affects how steadily your heart beats. If too much potassium builds up in your blood, it can cause an irregular heartbeat or even a heart attack.  You may need to eat less potassium, depending on your blood potassium levels and the stage of kidney disease. Talk to your dietitian about how  much potassium you may have each day.  You may need to limit or avoid foods that are high in potassium, such as:  · Milk and soy milk.  · Fruits, such as bananas, papaya, apricots, nectarines, melon, prunes, raisins, kiwi, and oranges.  · Vegetables, such as potatoes, sweet potatoes, yams, tomatoes, leafy greens, beets, okra, avocado, pumpkin, and winter squash.  · White and lima beans.    Phosphorus  Phosphorus is a mineral found in your bones. A balance between calcium and phosphorous is needed to build and maintain healthy bones. Too much phosphorus pulls calcium from your bones. This can make your bones weak and more likely to break. Too much phosphorus can also make your skin itch.  You may need to eat less phosphorus depending on your blood phosphorus levels and the stage of kidney disease. Talk to your dietitian about how much potassium you may have each day. You may need to take medicine to lower your blood phosphorus levels if diet changes do not help.  You may need to limit or avoid foods that are high in phosphorus, such as:  · Milk and dairy products.  · Dried beans and peas.  · Tofu, soy milk, and other soy-based meat replacements.  · Colas.  · Nuts and peanut butter.  · Meat, poultry, and fish.  · Bran cereals and oatmeals.    Protein  Protein helps you to make and keep muscle. It also helps in the repair of your body’s cells and tissues. One of the natural breakdown products of protein is a waste product called urea. When your kidneys are not working properly, they cannot remove wastes, such as urea, like they did before you developed chronic kidney disease. Reducing how much protein you eat can help prevent a buildup of urea in your blood.  Depending on your stage of kidney disease, you may need to limit foods that are high in protein. Sources of animal protein include:  · Meat (all types).  · Fish and seafood.  · Poultry.  · Eggs.  · Dairy.    Other protein foods include:  · Beans and legumes.   · Nuts and nut butter.  · Soy and tofu.    Sodium  Sodium, which is found in salt, helps maintain a healthy balance of fluids in your body. Too much sodium can increase your blood pressure and have a negative effect on the function of your heart and lungs. Too much sodium can also cause your body to retain too much fluid, making your kidneys work harder.  Most people should have less than 2,300 milligrams (mg) of sodium each day. If you have hypertension, you may need to limit your sodium to 1,500 mg each day. Talk to your dietitian about how much sodium you may have each day.  You may need to limit or avoid foods that are high in sodium, such   as:  · Salt seasonings.  · Soy sauce.  · Cured and processed meats.  · Salted crackers and snack foods.  · Fast food.  · Canned soups and most canned foods.  · Pickled foods.  · Vegetable juice.  · Boxed mixes or ready-to-eat boxed meals and side dishes.  · Bottled dressings, sauces, and marinades.    Summary  · Chronic kidney disease can lead to a buildup and imbalance of waste and excess substances in the body. Certain foods lead to a buildup of these substances. By adjusting your intake of these foods, you could help prevent more kidney damage and delay or prevent the need for dialysis.  · Food adjustments are different for each person with chronic kidney disease. Work with a dietitian to set up nutrient goals and a meal plan that is right for you.  · If you have diabetes and chronic kidney disease, it is important to keep your blood glucose in the target range recommended by your health care provider.  This information is not intended to replace advice given to you by your health care provider. Make sure you discuss any questions you have with your health care provider.  Document Released: 09/18/2002 Document Revised: 06/23/2016 Document Reviewed: 06/23/2016  Elsevier Interactive Patient Education © 2018 Elsevier Inc.

## 2018-04-13 DIAGNOSIS — D631 Anemia in chronic kidney disease: Secondary | ICD-10-CM | POA: Diagnosis not present

## 2018-04-13 DIAGNOSIS — I132 Hypertensive heart and chronic kidney disease with heart failure and with stage 5 chronic kidney disease, or end stage renal disease: Secondary | ICD-10-CM | POA: Diagnosis not present

## 2018-04-13 DIAGNOSIS — E1122 Type 2 diabetes mellitus with diabetic chronic kidney disease: Secondary | ICD-10-CM | POA: Diagnosis not present

## 2018-04-13 DIAGNOSIS — M1990 Unspecified osteoarthritis, unspecified site: Secondary | ICD-10-CM | POA: Diagnosis not present

## 2018-04-13 DIAGNOSIS — Z9114 Patient's other noncompliance with medication regimen: Secondary | ICD-10-CM | POA: Diagnosis not present

## 2018-04-13 DIAGNOSIS — J45909 Unspecified asthma, uncomplicated: Secondary | ICD-10-CM | POA: Diagnosis not present

## 2018-04-13 DIAGNOSIS — I5032 Chronic diastolic (congestive) heart failure: Secondary | ICD-10-CM | POA: Diagnosis not present

## 2018-04-13 DIAGNOSIS — R079 Chest pain, unspecified: Secondary | ICD-10-CM | POA: Diagnosis not present

## 2018-04-13 DIAGNOSIS — N185 Chronic kidney disease, stage 5: Secondary | ICD-10-CM | POA: Diagnosis not present

## 2018-04-13 DIAGNOSIS — R002 Palpitations: Secondary | ICD-10-CM | POA: Diagnosis not present

## 2018-04-20 DIAGNOSIS — R002 Palpitations: Secondary | ICD-10-CM | POA: Diagnosis not present

## 2018-04-20 DIAGNOSIS — E1122 Type 2 diabetes mellitus with diabetic chronic kidney disease: Secondary | ICD-10-CM | POA: Diagnosis not present

## 2018-04-20 DIAGNOSIS — N185 Chronic kidney disease, stage 5: Secondary | ICD-10-CM | POA: Diagnosis not present

## 2018-04-20 DIAGNOSIS — I132 Hypertensive heart and chronic kidney disease with heart failure and with stage 5 chronic kidney disease, or end stage renal disease: Secondary | ICD-10-CM | POA: Diagnosis not present

## 2018-04-20 DIAGNOSIS — J45909 Unspecified asthma, uncomplicated: Secondary | ICD-10-CM | POA: Diagnosis not present

## 2018-04-20 DIAGNOSIS — I5032 Chronic diastolic (congestive) heart failure: Secondary | ICD-10-CM | POA: Diagnosis not present

## 2018-04-20 DIAGNOSIS — Z9114 Patient's other noncompliance with medication regimen: Secondary | ICD-10-CM | POA: Diagnosis not present

## 2018-04-20 DIAGNOSIS — R079 Chest pain, unspecified: Secondary | ICD-10-CM | POA: Diagnosis not present

## 2018-04-20 DIAGNOSIS — D631 Anemia in chronic kidney disease: Secondary | ICD-10-CM | POA: Diagnosis not present

## 2018-04-20 DIAGNOSIS — M1990 Unspecified osteoarthritis, unspecified site: Secondary | ICD-10-CM | POA: Diagnosis not present

## 2018-04-25 ENCOUNTER — Encounter (HOSPITAL_COMMUNITY): Payer: Self-pay | Admitting: Emergency Medicine

## 2018-04-25 ENCOUNTER — Emergency Department (HOSPITAL_COMMUNITY)
Admission: EM | Admit: 2018-04-25 | Discharge: 2018-04-25 | Disposition: A | Discharge status: Home / Self Care | Payer: Medicare Other | Attending: Emergency Medicine | Admitting: Emergency Medicine

## 2018-04-25 DIAGNOSIS — R531 Weakness: Secondary | ICD-10-CM | POA: Insufficient documentation

## 2018-04-25 DIAGNOSIS — R58 Hemorrhage, not elsewhere classified: Secondary | ICD-10-CM | POA: Diagnosis not present

## 2018-04-25 DIAGNOSIS — E119 Type 2 diabetes mellitus without complications: Secondary | ICD-10-CM | POA: Diagnosis not present

## 2018-04-25 DIAGNOSIS — I5032 Chronic diastolic (congestive) heart failure: Secondary | ICD-10-CM | POA: Insufficient documentation

## 2018-04-25 DIAGNOSIS — E039 Hypothyroidism, unspecified: Secondary | ICD-10-CM | POA: Diagnosis not present

## 2018-04-25 DIAGNOSIS — Z79899 Other long term (current) drug therapy: Secondary | ICD-10-CM | POA: Diagnosis not present

## 2018-04-25 DIAGNOSIS — I10 Essential (primary) hypertension: Secondary | ICD-10-CM | POA: Diagnosis not present

## 2018-04-25 DIAGNOSIS — I132 Hypertensive heart and chronic kidney disease with heart failure and with stage 5 chronic kidney disease, or end stage renal disease: Secondary | ICD-10-CM | POA: Insufficient documentation

## 2018-04-25 DIAGNOSIS — N185 Chronic kidney disease, stage 5: Secondary | ICD-10-CM | POA: Insufficient documentation

## 2018-04-25 DIAGNOSIS — J45909 Unspecified asthma, uncomplicated: Secondary | ICD-10-CM | POA: Diagnosis not present

## 2018-04-25 LAB — CBC
HCT: 29.7 % — ABNORMAL LOW (ref 36.0–46.0)
Hemoglobin: 9 g/dL — ABNORMAL LOW (ref 12.0–15.0)
MCH: 28.8 pg (ref 26.0–34.0)
MCHC: 30.3 g/dL (ref 30.0–36.0)
MCV: 94.9 fL (ref 80.0–100.0)
NRBC: 0.2 % (ref 0.0–0.2)
PLATELETS: 437 10*3/uL — AB (ref 150–400)
RBC: 3.13 MIL/uL — AB (ref 3.87–5.11)
RDW: 16 % — AB (ref 11.5–15.5)
WBC: 8.1 10*3/uL (ref 4.0–10.5)

## 2018-04-25 LAB — COMPREHENSIVE METABOLIC PANEL WITH GFR
ALT: 14 U/L (ref 0–44)
AST: 30 U/L (ref 15–41)
Albumin: 3.1 g/dL — ABNORMAL LOW (ref 3.5–5.0)
Alkaline Phosphatase: 48 U/L (ref 38–126)
Anion gap: 9 (ref 5–15)
BUN: 72 mg/dL — ABNORMAL HIGH (ref 8–23)
CO2: 21 mmol/L — ABNORMAL LOW (ref 22–32)
Calcium: 8.5 mg/dL — ABNORMAL LOW (ref 8.9–10.3)
Chloride: 112 mmol/L — ABNORMAL HIGH (ref 98–111)
Creatinine, Ser: 4.21 mg/dL — ABNORMAL HIGH (ref 0.44–1.00)
GFR calc Af Amer: 10 mL/min — ABNORMAL LOW
GFR calc non Af Amer: 8 mL/min — ABNORMAL LOW
Glucose, Bld: 96 mg/dL (ref 70–99)
Potassium: 4.8 mmol/L (ref 3.5–5.1)
Sodium: 142 mmol/L (ref 135–145)
Total Bilirubin: 0.8 mg/dL (ref 0.3–1.2)
Total Protein: 5.9 g/dL — ABNORMAL LOW (ref 6.5–8.1)

## 2018-04-25 LAB — I-STAT CHEM 8, ED
BUN: 78 mg/dL — ABNORMAL HIGH (ref 8–23)
Calcium, Ion: 1.15 mmol/L (ref 1.15–1.40)
Chloride: 111 mmol/L (ref 98–111)
Creatinine, Ser: 4.7 mg/dL — ABNORMAL HIGH (ref 0.44–1.00)
GLUCOSE: 96 mg/dL (ref 70–99)
HEMATOCRIT: 24 % — AB (ref 36.0–46.0)
HEMOGLOBIN: 8.2 g/dL — AB (ref 12.0–15.0)
Potassium: 4.6 mmol/L (ref 3.5–5.1)
SODIUM: 142 mmol/L (ref 135–145)
TCO2: 23 mmol/L (ref 22–32)

## 2018-04-25 LAB — URINALYSIS, ROUTINE W REFLEX MICROSCOPIC
BACTERIA UA: NONE SEEN
Bilirubin Urine: NEGATIVE
Glucose, UA: NEGATIVE mg/dL
Hgb urine dipstick: NEGATIVE
Ketones, ur: NEGATIVE mg/dL
LEUKOCYTES UA: NEGATIVE
NITRITE: NEGATIVE
Protein, ur: 100 mg/dL — AB
SPECIFIC GRAVITY, URINE: 1.009 (ref 1.005–1.030)
pH: 7 (ref 5.0–8.0)

## 2018-04-25 LAB — CBG MONITORING, ED: Glucose-Capillary: 104 mg/dL — ABNORMAL HIGH (ref 70–99)

## 2018-04-25 LAB — I-STAT TROPONIN, ED: Troponin i, poc: 0.02 ng/mL (ref 0.00–0.08)

## 2018-04-25 LAB — TSH: TSH: 23.607 u[IU]/mL — AB (ref 0.350–4.500)

## 2018-04-25 MED ORDER — LISINOPRIL 2.5 MG PO TABS
2.5000 mg | ORAL_TABLET | Freq: Once | ORAL | Status: DC
  Filled 2018-04-25: qty 1

## 2018-04-25 MED ORDER — CARVEDILOL 12.5 MG PO TABS
12.5000 mg | ORAL_TABLET | Freq: Once | ORAL | Status: DC
  Filled 2018-04-25: qty 1

## 2018-04-25 MED ORDER — LEVOTHYROXINE SODIUM 75 MCG PO TABS
75.0000 ug | ORAL_TABLET | Freq: Once | ORAL | Status: AC
  Administered 2018-04-25: 75 ug via ORAL
  Filled 2018-04-25: qty 1

## 2018-04-25 MED ORDER — SODIUM CHLORIDE 0.9 % IV BOLUS
500.0000 mL | Freq: Once | INTRAVENOUS | Status: AC
  Administered 2018-04-25: 500 mL via INTRAVENOUS

## 2018-04-25 NOTE — Progress Notes (Addendum)
CSW aware of consult. CSW following up.   CSW spoke with pt and pt's grandson at bedside. Pt is requesting to return home. RNCM set up home health with [facility]. Pt is on [place], CSW in basketed [facility] about pt. Pt has multiple family members who check on her daily. Per pt's grandson, pt has neighbors that are close to the family as well and check on her. CSW discussed applying for a life alert for the pt. Pt agreeable.   CSW at times had difficulty understanding pt. Pt expressed concerns about [employer] stopping a specific medication she received from [place]. CSW suggested that pt bring up her concerns to her PCP and ask him to re- prescribe the medication. Pt agreeable to this.   No more social work needs at this time. CSW signing off.   Wendelyn Breslow, [facility]  [phone]

## 2018-04-25 NOTE — Discharge Planning (Signed)
Pt currently active with [facility] for RN/SW services.  Resumption of care requested. Havery Moros, RN of [facility] notified.  No DME needs identified at this time.

## 2018-04-25 NOTE — ED Provider Notes (Signed)
[facility] EMERGENCY DEPARTMENT Provider Note   CSN: [id number] Arrival date & time: 04/25/18  1339     History   Chief Complaint Chief Complaint  Patient presents with  . Weakness    HPI Janet [name] is a 84 y.o. female.  Patient with generalized weakness for the past week. Pt very poor/limited historian - level 5 caveat. Recent admission w similar symptoms, chronic kidney disease, chronic anemia, general weakness. Pt denies fevers. Denies trauma or fall. No chest pain. No abd pain. Denies vomiting or diarrhea.   The history is provided by the patient and the EMS personnel. The history is limited by the condition of the patient.  Weakness  Pertinent negatives include no shortness of breath, no chest pain, no vomiting, no confusion and no headaches.    Past Medical History:  Diagnosis Date  . Anemia   . Arthritis   . Asthma   . Diabetes mellitus    type 2  . Family history of adverse reaction to anesthesia    daughter has " a allergic reaction to anesthesia " does not know which one   . Gall stones   . Hypertension   . Kidney stones   . Thyroid disease     Patient Active Problem List   Diagnosis Date Noted  . Debility 04/02/2018  . Chest pain 04/02/2018  . Chronic diastolic CHF (congestive heart failure) ([place]) 04/02/2018  . Symptomatic anemia 04/01/2018  . Anemia 04/20/2017  . CKD (chronic kidney disease), stage V ([place]) 04/20/2017  . Kidney stones   . SBO (small bowel obstruction) ([place]) 06/27/2014  . Hypothyroidism 06/27/2014  . Benign essential HTN 06/27/2014  . DM type 2 (diabetes mellitus, type 2) ([place]) 06/27/2014  . Dilation of biliary tract 06/27/2014    Past Surgical History:  Procedure Laterality Date  . ABDOMINAL HYSTERECTOMY    . CATARACT EXTRACTION Bilateral   . CHOLECYSTECTOMY    . HERNIA REPAIR     open     OB History   None      Home Medications    Prior to Admission medications   Medication Sig Start Date  End Date Taking? Authorizing Provider  amLODipine (NORVASC) 10 MG tablet Take 1 tablet (10 mg total) by mouth daily. 04/03/18   Doreatha Lew, MD  carvedilol (COREG) 12.5 MG tablet Take 1 tablet (12.5 mg total) by mouth 2 (two) times daily with a meal. 04/03/18   Patrecia Pour, Christean Grief, MD  levothyroxine (SYNTHROID) 75 MCG tablet Take 1 tablet (75 mcg total) by mouth daily. 04/03/18 04/03/19  Doreatha Lew, MD  lisinopril (PRINIVIL,ZESTRIL) 2.5 MG tablet Take 1 tablet (2.5 mg total) by mouth daily. 04/04/18   Doreatha Lew, MD  Multiple Vitamins-Minerals (CENTRUM ADULTS PO) Take 1 tablet by mouth daily.     [provider]  omega-3 acid ethyl esters (LOVAZA) 1 g capsule Take 1 g by mouth daily.    [provider]  Tetrahydrozoline HCl (VISINE OP) Place 1-2 drops into both eyes daily as needed (watery eyes).    [provider]    Family History Family History  Problem Relation Age of Onset  . Heart attack Mother   . Cancer Brother   . Diabetes Other     Social History Social History   Tobacco Use  . Smoking status: Never Smoker  . Smokeless tobacco: Never Used  Substance Use Topics  . Alcohol use: No  . Drug use: No  Allergies   Other   Review of Systems Review of Systems  Constitutional: Negative for fever.  HENT: Negative for sore throat.   Eyes: Negative for redness.  Respiratory: Negative for cough and shortness of breath.   Cardiovascular: Negative for chest pain.  Gastrointestinal: Negative for abdominal pain and vomiting.  Genitourinary: Negative for dysuria and flank pain.  Musculoskeletal: Negative for back pain.  Skin: Negative for rash.  Neurological: Positive for weakness. Negative for headaches.  Hematological: Does not bruise/bleed easily.  Psychiatric/Behavioral: Negative for confusion.     Physical Exam Updated Vital Signs BP (!) 231/77 (BP Location: Left Arm)   Pulse 77   Temp 98 F (36.7 C) (Oral)   Resp  17   SpO2 93%   Physical Exam  Constitutional: She appears well-developed and well-nourished.  HENT:  Head: Atraumatic.  Mouth/Throat: Oropharynx is clear and moist.  Eyes: Pupils are equal, round, and reactive to light. Conjunctivae are normal. No scleral icterus.  Neck: Neck supple. No tracheal deviation present.  No stiffness or rigidity. No bruit.   Cardiovascular: Normal rate, regular rhythm, normal heart sounds and intact distal pulses.  Pulmonary/Chest: Effort normal and breath sounds normal. No respiratory distress.  Abdominal: Soft. Normal appearance and bowel sounds are normal. She exhibits no distension. There is no tenderness. There is no guarding.  Genitourinary:  Genitourinary Comments: No cva tenderness  Musculoskeletal: She exhibits no edema.  Neurological: She is alert.  Speech quiet/slow in quality. Is oriented to person/place. Motor/sens grossly intact bil.   Skin: Skin is warm and dry. No rash noted.  Psychiatric: She has a normal mood and affect.  Nursing note and vitals reviewed.    ED Treatments / Results  Labs (all labs ordered are listed, but only abnormal results are displayed) Results for orders placed or performed during the hospital encounter of 04/25/18  CBC  Result Value Ref Range   WBC 8.1 4.0 - 10.5 K/uL   RBC 3.13 (L) 3.87 - 5.11 MIL/uL   Hemoglobin 9.0 (L) 12.0 - 15.0 g/dL   HCT 29.7 (L) 36.0 - 46.0 %   MCV 94.9 80.0 - 100.0 fL   MCH 28.8 26.0 - 34.0 pg   MCHC 30.3 30.0 - 36.0 g/dL   RDW 16.0 (H) 11.5 - 15.5 %   Platelets 437 (H) 150 - 400 K/uL   nRBC 0.2 0.0 - 0.2 %  CBG monitoring, ED  Result Value Ref Range   Glucose-Capillary 104 (H) 70 - 99 mg/dL   Comment 1 Notify RN   I-stat troponin, ED  Result Value Ref Range   Troponin i, poc 0.02 0.00 - 0.08 ng/mL   Comment 3           Dg Chest 2 View  Result Date: 04/01/2018 CLINICAL DATA:  Left side chest pain EXAM: CHEST - 2 VIEW COMPARISON:  07/10/2017 FINDINGS: Cardiomegaly. No  overt edema. No confluent opacities or effusions. No acute bony abnormality. IMPRESSION: Cardiomegaly.  No active disease. Electronically Signed   By: Rolm Baptise M.D.   On: 04/01/2018 21:55    EKG EKG Interpretation  Date/Time:  Tuesday April 25 2018 13:55:13 EDT Ventricular Rate:  79 PR Interval:    QRS Duration: 93 QT Interval:  403 QTC Calculation: 462 R Axis:   -65 Text Interpretation:  Sinus rhythm Left anterior fascicular block Baseline wander No significant change since last tracing Confirmed by Lajean Saver [phone]) on 04/25/2018 2:27:12 PM   Radiology No results found.  Procedures  Procedures (including critical care time)  Medications Ordered in ED Medications  sodium chloride 0.9 % bolus 500 mL (has no administration in time range)     Initial Impression / Assessment and Plan / ED Course  I have reviewed the triage vital signs and the nursing notes.  Pertinent labs & imaging results that were available during my care of the patient were reviewed by me and considered in my medical decision making (see chart for details).  Labs sent.   Reviewed nursing notes and prior charts for additional history.   Cxr reviewed - no pna.   Labs reviewed -  Trop normal, additional lab results remain pending.  SW consult pending re ?safe home situation/?home health resources optimized.  Pt indicates she has meds, but that she is not taking her meds regularly. bp is high, will give dose of her home meds as hasnt had today.   1535 labs pending, signed out to Dr Dayna Barker to check labs, and dispo appropriately.     Final Clinical Impressions(s) / ED Diagnoses   Final diagnoses:  None    ED Discharge Orders    None       Lajean Saver, MD 04/25/18 1535

## 2018-04-25 NOTE — ED Notes (Signed)
Discharge paperwork reviewed with pt and grandson. Pt discharged home with grandson.

## 2018-04-25 NOTE — ED Notes (Signed)
Pt refusing to take medications. States these medications given her bumps on back of her head and breaks out all over her chest.

## 2018-04-25 NOTE — ED Triage Notes (Signed)
Pt arrives via EMS from home with complaints of generalized weakness today. Pt reports being in the hospital about a month ago and medications were changed, states that they have not helped.

## 2018-04-25 NOTE — ED Notes (Addendum)
CBG collected. Result "104." RN, Maggie, notified.

## 2018-04-25 NOTE — ED Provider Notes (Signed)
12:11 AM Assumed care from Dr. Ashok Cordia, please see their note for full history, physical and decision making until this point. In brief this is a 84 y.o. year old female who presented to the ED tonight with Weakness     Patient here with persistent weakness and pending labs for disposition.  On review the records appears patient been ambulate around the department without much difficulty and no assistance.  On my exam she is hypertensive, pending getting her antihypertensive medications which she did not take, otherwise there is no e/o end organ damage. Pending labs for disposition.  SW seen. BMP added on and is without acute changes, normal K, no need for emergent dialysis. Stable for dc.   Discharge instructions, including strict return precautions for new or worsening symptoms, given. Patient and/or family verbalized understanding and agreement with the plan as described.   Labs, studies and imaging reviewed by myself and considered in medical decision making if ordered. Imaging interpreted by radiology.  Labs Reviewed  CBC - Abnormal; Notable for the following components:      Result Value   RBC 3.13 (*)    Hemoglobin 9.0 (*)    HCT 29.7 (*)    RDW 16.0 (*)    Platelets 437 (*)    All other components within normal limits  URINALYSIS, ROUTINE W REFLEX MICROSCOPIC - Abnormal; Notable for the following components:   Protein, ur 100 (*)    All other components within normal limits  TSH - Abnormal; Notable for the following components:   TSH 23.607 (*)    All other components within normal limits  COMPREHENSIVE METABOLIC PANEL - Abnormal; Notable for the following components:   Chloride 112 (*)    CO2 21 (*)    BUN 72 (*)    Creatinine, Ser 4.21 (*)    Calcium 8.5 (*)    Total Protein 5.9 (*)    Albumin 3.1 (*)    GFR calc non Af Amer 8 (*)    GFR calc Af Amer 10 (*)    All other components within normal limits  CBG MONITORING, ED - Abnormal; Notable for the following components:    Glucose-Capillary 104 (*)    All other components within normal limits  I-STAT CHEM 8, ED - Abnormal; Notable for the following components:   BUN 78 (*)    Creatinine, Ser 4.70 (*)    Hemoglobin 8.2 (*)    HCT 24.0 (*)    All other components within normal limits  I-STAT TROPONIN, ED    No orders to display    No follow-ups on file.    Saron Vanorman, Corene Cornea, MD 04/26/18 [phone]

## 2018-04-25 NOTE — ED Notes (Signed)
Pt ambulated self efficiently to restroom with no difficulty. Pt ambulated safely to bedside. 

## 2018-04-27 DIAGNOSIS — I444 Left anterior fascicular block: Secondary | ICD-10-CM | POA: Diagnosis not present

## 2018-04-27 DIAGNOSIS — R7989 Other specified abnormal findings of blood chemistry: Secondary | ICD-10-CM | POA: Diagnosis not present

## 2018-04-27 DIAGNOSIS — N185 Chronic kidney disease, stage 5: Secondary | ICD-10-CM | POA: Diagnosis not present

## 2018-04-27 DIAGNOSIS — G4489 Other headache syndrome: Secondary | ICD-10-CM | POA: Diagnosis not present

## 2018-04-27 DIAGNOSIS — N289 Disorder of kidney and ureter, unspecified: Secondary | ICD-10-CM | POA: Diagnosis not present

## 2018-04-27 DIAGNOSIS — E1122 Type 2 diabetes mellitus with diabetic chronic kidney disease: Secondary | ICD-10-CM | POA: Diagnosis not present

## 2018-04-27 DIAGNOSIS — R531 Weakness: Secondary | ICD-10-CM | POA: Diagnosis not present

## 2018-04-27 DIAGNOSIS — R002 Palpitations: Secondary | ICD-10-CM | POA: Diagnosis not present

## 2018-04-27 DIAGNOSIS — R600 Localized edema: Secondary | ICD-10-CM | POA: Diagnosis not present

## 2018-04-27 DIAGNOSIS — R42 Dizziness and giddiness: Secondary | ICD-10-CM | POA: Diagnosis not present

## 2018-04-27 DIAGNOSIS — I10 Essential (primary) hypertension: Secondary | ICD-10-CM | POA: Diagnosis not present

## 2018-04-27 DIAGNOSIS — J45909 Unspecified asthma, uncomplicated: Secondary | ICD-10-CM | POA: Diagnosis not present

## 2018-04-27 DIAGNOSIS — I132 Hypertensive heart and chronic kidney disease with heart failure and with stage 5 chronic kidney disease, or end stage renal disease: Secondary | ICD-10-CM | POA: Diagnosis not present

## 2018-04-27 DIAGNOSIS — D631 Anemia in chronic kidney disease: Secondary | ICD-10-CM | POA: Diagnosis not present

## 2018-04-27 DIAGNOSIS — R079 Chest pain, unspecified: Secondary | ICD-10-CM | POA: Diagnosis not present

## 2018-04-27 DIAGNOSIS — I169 Hypertensive crisis, unspecified: Secondary | ICD-10-CM | POA: Diagnosis not present

## 2018-04-27 DIAGNOSIS — N179 Acute kidney failure, unspecified: Secondary | ICD-10-CM | POA: Diagnosis not present

## 2018-04-27 DIAGNOSIS — I5032 Chronic diastolic (congestive) heart failure: Secondary | ICD-10-CM | POA: Diagnosis not present

## 2018-04-27 DIAGNOSIS — I120 Hypertensive chronic kidney disease with stage 5 chronic kidney disease or end stage renal disease: Secondary | ICD-10-CM | POA: Diagnosis not present

## 2018-04-27 DIAGNOSIS — N184 Chronic kidney disease, stage 4 (severe): Secondary | ICD-10-CM | POA: Diagnosis not present

## 2018-04-27 DIAGNOSIS — Z9114 Patient's other noncompliance with medication regimen: Secondary | ICD-10-CM | POA: Diagnosis not present

## 2018-04-27 DIAGNOSIS — I2510 Atherosclerotic heart disease of native coronary artery without angina pectoris: Secondary | ICD-10-CM | POA: Diagnosis not present

## 2018-04-27 DIAGNOSIS — M1990 Unspecified osteoarthritis, unspecified site: Secondary | ICD-10-CM | POA: Diagnosis not present

## 2018-04-28 ENCOUNTER — Other Ambulatory Visit: Payer: Self-pay | Admitting: *Deleted

## 2018-04-28 MED ORDER — ONDANSETRON 4 MG PO TBDP
4.00 | ORAL_TABLET | ORAL | Status: DC
Start: ? — End: 2018-04-28

## 2018-04-28 MED ORDER — TRAMADOL HCL 50 MG PO TABS
50.00 | ORAL_TABLET | ORAL | Status: DC
Start: ? — End: 2018-04-28

## 2018-04-28 MED ORDER — LEVOTHYROXINE SODIUM 50 MCG PO TABS
50.00 | ORAL_TABLET | ORAL | Status: DC
Start: 2018-04-29 — End: 2018-04-28

## 2018-04-28 MED ORDER — FUROSEMIDE 40 MG PO TABS
40.00 | ORAL_TABLET | ORAL | Status: DC
Start: 2018-04-29 — End: 2018-04-28

## 2018-04-28 MED ORDER — ACETAMINOPHEN 325 MG PO TABS
650.00 | ORAL_TABLET | ORAL | Status: DC
Start: ? — End: 2018-04-28

## 2018-04-28 MED ORDER — SODIUM CHLORIDE 0.9 % IV SOLN
250.00 | INTRAVENOUS | Status: DC
Start: 2018-04-28 — End: 2018-04-28

## 2018-04-28 MED ORDER — CARVEDILOL 3.125 MG PO TABS
3.13 | ORAL_TABLET | ORAL | Status: DC
Start: 2018-04-29 — End: 2018-04-28

## 2018-04-28 MED ORDER — BISACODYL 5 MG PO TBEC
10.00 | DELAYED_RELEASE_TABLET | ORAL | Status: DC
Start: ? — End: 2018-04-28

## 2018-04-28 MED ORDER — HEPARIN SODIUM (PORCINE) 5000 UNIT/ML IJ SOLN
5000.00 | INTRAMUSCULAR | Status: DC
Start: 2018-04-28 — End: 2018-04-28

## 2018-04-28 MED ORDER — HYDRALAZINE HCL 20 MG/ML IJ SOLN
10.00 | INTRAMUSCULAR | Status: DC
Start: ? — End: 2018-04-28

## 2018-04-28 MED ORDER — GUAIFENESIN-DM 100-10 MG/5ML PO SYRP
5.00 | ORAL_SOLUTION | ORAL | Status: DC
Start: ? — End: 2018-04-28

## 2018-04-28 NOTE — Patient Outreach (Signed)
  [facility]) Care Management  04/28/2018  Janet [name] December 14, 1923 [id number]   Telephone Screen  Referral Date: 04/27/18 Referral Source: Um Referral  Referral Reason: phone number [phone] She declined the ER follow up call from [facility] staff, was very hesitant to verify HIPPA over the phone, Member states that she would like help but did not stat what type of help. English is not too clear. She kept mentioning her daughter [name].  [name] number is [phone]  ED visits x 2 last on 04/25/18  Insurance:[employer] medicare   Outreach attempt # 1 successful but the female that answered reports [name] is in "the hospital" at the time of the call for an elevated BP that was taken by her home health RN and may be discharged in the next few days "I'm not sure" She also has chest pain and sob She gave the phone to [name] but the female came back and reports [name] could not hear well  CM notes on Epic pt is not listed in a [place] ED or hospital   CM notes from Care everywhere that [name] is at a [facility] since 04/27/18   Conditions hypertension chronic kidney disease stage V, diabetes mellitus type 2 , CHF, SBO, hypothyroidism, anemia, hx kidney stones  Plan: [facility] RN CM will updated [facility] CMA and closed this case  Consumer does not meet program criteria It was determined that the consumer is hospitalized  [place]. Lavina Hamman, RN, BSN, [place] Coordinator Office number [phone] Mobile number [phone]  Main [facility] number [phone] Fax number [phone]

## 2018-05-15 DIAGNOSIS — Z79899 Other long term (current) drug therapy: Secondary | ICD-10-CM | POA: Diagnosis not present

## 2018-05-15 DIAGNOSIS — Z0000 Encounter for general adult medical examination without abnormal findings: Secondary | ICD-10-CM | POA: Diagnosis not present

## 2018-05-15 DIAGNOSIS — R7303 Prediabetes: Secondary | ICD-10-CM | POA: Diagnosis not present

## 2018-05-15 DIAGNOSIS — I110 Hypertensive heart disease with heart failure: Secondary | ICD-10-CM | POA: Diagnosis not present

## 2018-05-15 DIAGNOSIS — I10 Essential (primary) hypertension: Secondary | ICD-10-CM | POA: Diagnosis not present

## 2018-05-15 DIAGNOSIS — Z23 Encounter for immunization: Secondary | ICD-10-CM | POA: Diagnosis not present

## 2018-05-15 DIAGNOSIS — N185 Chronic kidney disease, stage 5: Secondary | ICD-10-CM | POA: Diagnosis not present

## 2018-07-26 ENCOUNTER — Encounter (HOSPITAL_COMMUNITY): Payer: Self-pay | Admitting: Emergency Medicine

## 2018-07-26 ENCOUNTER — Emergency Department (HOSPITAL_COMMUNITY): Payer: Medicare Other

## 2018-07-26 ENCOUNTER — Inpatient Hospital Stay (HOSPITAL_COMMUNITY)
Admission: EM | Admit: 2018-07-26 | Discharge: 2018-08-12 | DRG: 084 | Disposition: E | Payer: Medicare Other | Attending: Family Medicine | Admitting: Family Medicine

## 2018-07-26 DIAGNOSIS — R402113 Coma scale, eyes open, never, at hospital admission: Secondary | ICD-10-CM | POA: Diagnosis present

## 2018-07-26 DIAGNOSIS — R404 Transient alteration of awareness: Secondary | ICD-10-CM | POA: Diagnosis not present

## 2018-07-26 DIAGNOSIS — S0003XA Contusion of scalp, initial encounter: Secondary | ICD-10-CM | POA: Diagnosis not present

## 2018-07-26 DIAGNOSIS — S065XAA Traumatic subdural hemorrhage with loss of consciousness status unknown, initial encounter: Secondary | ICD-10-CM | POA: Diagnosis present

## 2018-07-26 DIAGNOSIS — Y92009 Unspecified place in unspecified non-institutional (private) residence as the place of occurrence of the external cause: Secondary | ICD-10-CM | POA: Diagnosis not present

## 2018-07-26 DIAGNOSIS — S299XXA Unspecified injury of thorax, initial encounter: Secondary | ICD-10-CM | POA: Diagnosis not present

## 2018-07-26 DIAGNOSIS — Z90710 Acquired absence of both cervix and uterus: Secondary | ICD-10-CM | POA: Diagnosis not present

## 2018-07-26 DIAGNOSIS — W19XXXA Unspecified fall, initial encounter: Secondary | ICD-10-CM | POA: Diagnosis present

## 2018-07-26 DIAGNOSIS — R402343 Coma scale, best motor response, flexion withdrawal, at hospital admission: Secondary | ICD-10-CM | POA: Diagnosis not present

## 2018-07-26 DIAGNOSIS — R0902 Hypoxemia: Secondary | ICD-10-CM | POA: Diagnosis not present

## 2018-07-26 DIAGNOSIS — R402213 Coma scale, best verbal response, none, at hospital admission: Secondary | ICD-10-CM | POA: Diagnosis not present

## 2018-07-26 DIAGNOSIS — S065X9A Traumatic subdural hemorrhage with loss of consciousness of unspecified duration, initial encounter: Secondary | ICD-10-CM | POA: Diagnosis not present

## 2018-07-26 DIAGNOSIS — J45909 Unspecified asthma, uncomplicated: Secondary | ICD-10-CM | POA: Diagnosis not present

## 2018-07-26 DIAGNOSIS — Z515 Encounter for palliative care: Secondary | ICD-10-CM | POA: Diagnosis not present

## 2018-07-26 DIAGNOSIS — R4020 Unspecified coma: Secondary | ICD-10-CM | POA: Diagnosis not present

## 2018-07-26 DIAGNOSIS — S199XXA Unspecified injury of neck, initial encounter: Secondary | ICD-10-CM | POA: Diagnosis not present

## 2018-07-26 DIAGNOSIS — I10 Essential (primary) hypertension: Secondary | ICD-10-CM | POA: Diagnosis not present

## 2018-07-26 DIAGNOSIS — R000 Tachycardia, unspecified: Secondary | ICD-10-CM | POA: Diagnosis not present

## 2018-07-26 DIAGNOSIS — E119 Type 2 diabetes mellitus without complications: Secondary | ICD-10-CM | POA: Diagnosis not present

## 2018-07-26 DIAGNOSIS — S065X0A Traumatic subdural hemorrhage without loss of consciousness, initial encounter: Secondary | ICD-10-CM | POA: Diagnosis not present

## 2018-07-26 DIAGNOSIS — S3993XA Unspecified injury of pelvis, initial encounter: Secondary | ICD-10-CM | POA: Diagnosis not present

## 2018-07-26 HISTORY — DX: Type 2 diabetes mellitus without complications: E11.9

## 2018-07-26 HISTORY — DX: Disorder of kidney and ureter, unspecified: N28.9

## 2018-07-26 LAB — PREPARE FRESH FROZEN PLASMA
Unit division: 0
Unit division: 0

## 2018-07-26 LAB — TYPE AND SCREEN
ABO/RH(D): O POS
Antibody Screen: NEGATIVE
Unit division: 0
Unit division: 0

## 2018-07-26 LAB — COMPREHENSIVE METABOLIC PANEL
ALT: 26 U/L (ref 0–44)
AST: 47 U/L — ABNORMAL HIGH (ref 15–41)
Albumin: 3.3 g/dL — ABNORMAL LOW (ref 3.5–5.0)
Alkaline Phosphatase: 63 U/L (ref 38–126)
Anion gap: 15 (ref 5–15)
BUN: 91 mg/dL — AB (ref 8–23)
CO2: 16 mmol/L — ABNORMAL LOW (ref 22–32)
CREATININE: 5.16 mg/dL — AB (ref 0.44–1.00)
Calcium: 8.6 mg/dL — ABNORMAL LOW (ref 8.9–10.3)
Chloride: 110 mmol/L (ref 98–111)
GFR calc Af Amer: 8 mL/min — ABNORMAL LOW (ref >60)
GFR calc non Af Amer: 7 mL/min — ABNORMAL LOW (ref >60)
GLUCOSE: 207 mg/dL — AB (ref 70–99)
Potassium: 5 mmol/L (ref 3.5–5.1)
Sodium: 141 mmol/L (ref 135–145)
Total Bilirubin: 0.6 mg/dL (ref 0.3–1.2)
Total Protein: 6.3 g/dL — ABNORMAL LOW (ref 6.5–8.1)

## 2018-07-26 LAB — CBC
HCT: 20.2 % — ABNORMAL LOW (ref 36.0–46.0)
Hemoglobin: 6.2 g/dL — CL (ref 12.0–15.0)
MCH: 30.8 pg (ref 26.0–34.0)
MCHC: 30.7 g/dL (ref 30.0–36.0)
MCV: 100.5 fL — ABNORMAL HIGH (ref 80.0–100.0)
Platelets: 576 10*3/uL — ABNORMAL HIGH (ref 150–400)
RBC: 2.01 MIL/uL — ABNORMAL LOW (ref 3.87–5.11)
RDW: 16.5 % — AB (ref 11.5–15.5)
WBC: 14.6 10*3/uL — ABNORMAL HIGH (ref 4.0–10.5)
nRBC: 0.1 % (ref 0.0–0.2)

## 2018-07-26 LAB — PROTIME-INR
INR: 1.14
Prothrombin Time: 14.5 seconds (ref 11.4–15.2)

## 2018-07-26 LAB — I-STAT CHEM 8, ED
BUN: 95 mg/dL — ABNORMAL HIGH (ref 8–23)
Calcium, Ion: 1.02 mmol/L — ABNORMAL LOW (ref 1.15–1.40)
Chloride: 111 mmol/L (ref 98–111)
Creatinine, Ser: 5.3 mg/dL — ABNORMAL HIGH (ref 0.44–1.00)
Glucose, Bld: 200 mg/dL — ABNORMAL HIGH (ref 70–99)
HCT: 21 % — ABNORMAL LOW (ref 36.0–46.0)
Hemoglobin: 7.1 g/dL — ABNORMAL LOW (ref 12.0–15.0)
Potassium: 5 mmol/L (ref 3.5–5.1)
Sodium: 140 mmol/L (ref 135–145)
TCO2: 18 mmol/L — ABNORMAL LOW (ref 22–32)

## 2018-07-26 LAB — ETHANOL: Alcohol, Ethyl (B): <10 mg/dL (ref <10)

## 2018-07-26 LAB — I-STAT CG4 LACTIC ACID, ED: Lactic Acid, Venous: 1.63 mmol/L (ref 0.5–1.9)

## 2018-07-26 LAB — BPAM FFP
BLOOD PRODUCT EXPIRATION DATE: 202001262359
Blood Product Expiration Date: 202001272359
ISSUE DATE / TIME: 202001150134
ISSUE DATE / TIME: 202001150134
Unit Type and Rh: 6200
Unit Type and Rh: 6200

## 2018-07-26 LAB — CDS SEROLOGY

## 2018-07-26 LAB — BPAM RBC
Blood Product Expiration Date: 202002012359
Blood Product Expiration Date: 202002012359
ISSUE DATE / TIME: 202001150134
ISSUE DATE / TIME: 202001150134
UNIT TYPE AND RH: 9500
Unit Type and Rh: 9500

## 2018-07-26 LAB — ABO/RH: ABO/RH(D): O POS

## 2018-07-26 MED ORDER — ONDANSETRON HCL 4 MG/2ML IJ SOLN
4.0000 mg | Freq: Once | INTRAMUSCULAR | Status: AC
  Administered 2018-07-26: 4 mg via INTRAVENOUS
  Filled 2018-07-26: qty 2

## 2018-07-26 MED ORDER — MORPHINE SULFATE (PF) 2 MG/ML IV SOLN
2.0000 mg | Freq: Once | INTRAVENOUS | Status: AC
  Administered 2018-07-26: 2 mg via INTRAVENOUS
  Filled 2018-07-26: qty 1

## 2018-08-12 NOTE — ED Triage Notes (Signed)
BIB [facility] from home with c/o of fall around 2300. Pt's daughter was with her and reports decreasing LOC since. Pt unresponsive on EMS arrival.

## 2018-08-12 NOTE — Consult Note (Signed)
Reason for Consult fall Referring Physician: [name] is an 85 y.o. female.  HPI: Patient presents as a level 1 after a fall from home 4 hours ago.  She was found minimally responsive and brought in EMS.  Upon arrival she had a GCS of 5 and protecting her airway.  No past medical history on file.    No family history on file.  Social History:  has no history on file for tobacco, alcohol, and drug.  Allergies: Allergies not on file  Medications: I have reviewed the patient's current medications.  Results for orders placed or performed during the hospital encounter of 08/25/2018 (from the past 48 hour(s))  Type and screen Ordered by PROVIDER DEFAULT     Status: None (Preliminary result)   Collection Time: 2018-08-25  1:32 AM  Result Value Ref Range   ABO/RH(D) PENDING    Antibody Screen PENDING    Sample Expiration      07/29/2018 Performed at [facility], [place] [address], [place], [place] [address]    Unit Number [id number]    Blood Component Type RBC LR PHER1    Unit division 00    Status of Unit ISSUED    Unit tag comment EMERGENCY RELEASE    Transfusion Status OK TO TRANSFUSE    Crossmatch Result PENDING    Unit Number [id number]    Blood Component Type RED CELLS,LR    Unit division 00    Status of Unit ISSUED    Unit tag comment EMERGENCY RELEASE    Transfusion Status OK TO TRANSFUSE    Crossmatch Result PENDING   Prepare fresh frozen plasma     Status: None (Preliminary result)   Collection Time: 2018/08/25  1:32 AM  Result Value Ref Range   Unit Number [id number]    Blood Component Type LIQ PLASMA    Unit division 00    Status of Unit ISSUED    Unit tag comment EMERGENCY RELEASE    Transfusion Status OK TO TRANSFUSE    Unit Number [id number]    Blood Component Type LIQ PLASMA    Unit division 00    Status of Unit ISSUED    Unit tag comment EMERGENCY RELEASE    Transfusion Status OK TO TRANSFUSE   CDS serology     Status: None    Collection Time: August 25, 2018  1:45 AM  Result Value Ref Range   CDS serology specimen      SPECIMEN WILL BE HELD FOR 14 DAYS IF TESTING IS REQUIRED    Comment: Performed at [facility], [place] [address] [place], [place] [address]  CBC     Status: Abnormal   Collection Time: August 25, 2018  1:45 AM  Result Value Ref Range   WBC 14.6 (H) 4.0 - 10.5 K/uL   RBC 2.01 (L) 3.87 - 5.11 MIL/uL   Hemoglobin 6.2 (LL) 12.0 - 15.0 g/dL    Comment: REPEATED TO VERIFY THIS CRITICAL RESULT HAS VERIFIED AND BEEN CALLED TO K PRUETT RN BY TIFFANY SHORT ON August 25, 2018 AT 0202, AND HAS BEEN READ BACK.     HCT 20.2 (L) 36.0 - 46.0 %   MCV 100.5 (H) 80.0 - 100.0 fL   MCH 30.8 26.0 - 34.0 pg   MCHC 30.7 30.0 - 36.0 g/dL   RDW 16.5 (H) 11.5 - 15.5 %   Platelets 576 (H) 150 - 400 K/uL   nRBC 0.1 0.0 - 0.2 %    Comment: Performed  at [facility], [place] [address] [place], [place] [address]  Protime-INR     Status: None   Collection Time: 2018-08-02  1:45 AM  Result Value Ref Range   Prothrombin Time 14.5 11.4 - 15.2 seconds   INR 1.14     Comment: Performed at [facility] [address] [place], [place] [address]  I-Stat Chem 8, ED     Status: Abnormal   Collection Time: August 02, 2018  1:52 AM  Result Value Ref Range   Sodium 140 135 - 145 mmol/L   Potassium 5.0 3.5 - 5.1 mmol/L   Chloride 111 98 - 111 mmol/L   BUN 95 (H) 8 - 23 mg/dL   Creatinine, Ser 5.30 (H) 0.44 - 1.00 mg/dL   Glucose, Bld 200 (H) 70 - 99 mg/dL   Calcium, Ion 1.02 (L) 1.15 - 1.40 mmol/L   TCO2 18 (L) 22 - 32 mmol/L   Hemoglobin 7.1 (L) 12.0 - 15.0 g/dL   HCT 21.0 (L) 36.0 - 46.0 %  I-Stat CG4 Lactic Acid, ED     Status: None   Collection Time: 2018/08/02  1:53 AM  Result Value Ref Range   Lactic Acid, Venous 1.63 0.5 - 1.9 mmol/L    No results found.  Review of Systems  Unable to perform ROS: Acuity of condition   Blood pressure (!) 215/77, pulse 100, temperature (!) 95.9 F (35.5 C), resp. rate (!) 24, height 5'  (1.524 m), weight 45.4 kg, SpO2 94 %. Physical Exam  Constitutional: She appears distressed.  HENT:  Hematoma left side of scalp  Eyes:  Blown left pupil  Cardiovascular: Normal rate.  Respiratory: Effort normal.  Musculoskeletal: Normal range of motion.  Neurological: GCS eye subscore is 1. GCS verbal subscore is 1. GCS motor subscore is 3.    Assessment/Plan: Fall with large subdural hematoma in a 85 year old female  EDP had a discussion with the family and they do not want any mechanical ventilation, surgery or aggressive treatment.  It is unlikely she will survive this injury and may pass soon.  They have opted for comfort care.  Patient will be left in the emergency room for now.  If she is not expired in the next few hours, she will be admitted for comfort care.  Case discussed with the E DP   Janet [name] 08-02-2018, 2:14 AM

## 2018-08-12 NOTE — ED Provider Notes (Addendum)
[facility] DEPART[facility] Provider Note   CSN: [id number] Arrival date & time: 08-15-2018  0140     History   Chief Complaint Chief Complaint  Patient presents with  . Trauma    HPI Janet [name] is a 85 y.o. female.  Patient presents to the emergency department for evaluation after a fall.  Patient had a fall at home approximately 4 hours ago.  She hit the right side of her head.  Family was with her and has been monitoring her.  They report that over the time since she hit her head she has progressively become less alert.  She has brought to the emergency department by ambulance.  She has had snoring respirations and has not been responsive to any stimuli.     Past Medical History:  Diagnosis Date  . Arthritis   . Asthma   . Diabetes mellitus without complication ([place])   . Hypertension   . Renal disorder   . Thyroid disease     There are no active problems to display for this patient.   Past Surgical History:  Procedure Laterality Date  . ABDOMINAL HYSTERECTOMY    . CHOLECYSTECTOMY    . HERNIA REPAIR       OB History   No obstetric history on file.      Home Medications    Prior to Admission medications   Not on File    Family History No family history on file.  Social History Social History   Tobacco Use  . Smoking status: Never Smoker  . Smokeless tobacco: Never Used  Substance Use Topics  . Alcohol use: Never    Frequency: Never  . Drug use: Never     Allergies   Patient has no known allergies.   Review of Systems Review of Systems  Unable to perform ROS: Acuity of condition     Physical Exam Updated Vital Signs BP (!) 236/83   Pulse 87   Temp (!) 95.9 F (35.5 C)   Resp (!) 23   Ht 5' (1.524 m)   Wt 45.4 kg   SpO2 100%   BMI 19.53 kg/m   Physical Exam Constitutional:      General: She is in acute distress.  HENT:     Head: Contusion present.   Eyes:     Comments: Right pupil 2 mm,  sluggishly reactive Left pupil 5 mm not reactive  Cardiovascular:     Rate and Rhythm: Normal rate and regular rhythm.     Heart sounds: Normal heart sounds.  Pulmonary:     Comments: Slight tachypnea with snoring respirations Abdominal:     Palpations: Abdomen is soft.  Musculoskeletal:     Comments: Appears to move all 4 extremities but nothing purposeful  Skin:    General: Skin is warm and dry.  Neurological:     Comments: Snoring respirations, no response to stimuli      ED Treatments / Results  Labs (all labs ordered are listed, but only abnormal results are displayed) Labs Reviewed  COMPREHENSIVE METABOLIC PANEL - Abnormal; Notable for the following components:      Result Value   CO2 16 (*)    Glucose, Bld 207 (*)    BUN 91 (*)    Creatinine, Ser 5.16 (*)    Calcium 8.6 (*)    Total Protein 6.3 (*)    Albumin 3.3 (*)    AST 47 (*)    GFR calc non Af Wyvonnia Lora  7 (*)    GFR calc Af Amer 8 (*)    All other components within normal limits  CBC - Abnormal; Notable for the following components:   WBC 14.6 (*)    RBC 2.01 (*)    Hemoglobin 6.2 (*)    HCT 20.2 (*)    MCV 100.5 (*)    RDW 16.5 (*)    Platelets 576 (*)    All other components within normal limits  I-STAT CHEM 8, ED - Abnormal; Notable for the following components:   BUN 95 (*)    Creatinine, Ser 5.30 (*)    Glucose, Bld 200 (*)    Calcium, Ion 1.02 (*)    TCO2 18 (*)    Hemoglobin 7.1 (*)    HCT 21.0 (*)    All other components within normal limits  CDS SEROLOGY  ETHANOL  PROTIME-INR  URINALYSIS, ROUTINE W REFLEX MICROSCOPIC  I-STAT CG4 LACTIC ACID, ED  TYPE AND SCREEN  PREPARE FRESH FROZEN PLASMA  ABO/RH    EKG None  Radiology Ct Head Wo Contrast  Result Date: 01-Aug-2018 CLINICAL DATA:  Fall. EXAM: CT HEAD WITHOUT CONTRAST CT CERVICAL SPINE WITHOUT CONTRAST TECHNIQUE: Multidetector CT imaging of the head and cervical spine was performed following the standard protocol without intravenous  contrast. Multiplanar CT image reconstructions of the cervical spine were also generated. COMPARISON:  None. FINDINGS: CT HEAD FINDINGS Brain: There is acute intracranial subdural hematoma in the right frontotemporoparietal region measuring 2.7 cm maximal depth. Severe right to left midline shift measuring about 2.5 cm. Sub fall seen herniation. There is effacement of sulci and right lateral ventricle. Gray-white matter junctions remain distinct. Basal cisterns are effaced consistent with transtentorial herniation. Vascular: Moderate intracranial arterial vascular calcifications. Skull: Calvarium appears intact. No acute depressed skull fractures. Sinuses/Orbits: Paranasal sinuses and mastoid air cells are clear. Other: Moderate subcutaneous scalp hematoma over the right parietal region. CT CERVICAL SPINE FINDINGS Alignment: Slight anterior subluxation of C5 on C6 is nonspecific but probably degenerative. Normal alignment of the facet joints. C1-2 articulation appears intact. Skull base and vertebrae: Skull base appears intact. No vertebral compression deformities. No focal bone lesion or bone destruction. Bone cortex appears intact. Soft tissues and spinal canal: No prevertebral soft tissue swelling. No abnormal paraspinal soft tissue mass or infiltration. Disc levels: Intervertebral disc calcification and endplate hypertrophic changes consistent with degenerative change. Degenerative changes at C1-2. Degenerative changes in the facet joints. Upper chest: Motion artifact limits examination. No gross opacification. Other: None. IMPRESSION: 1. Acute intracranial subdural hematoma in the right frontotemporoparietal region with severe mass effect causing 2 point 5 cm right to left midline shift and subfalcine and transtentorial herniation. 2. Degenerative changes in the cervical spine. No acute displaced fractures identified. These results were called by telephone at the time of interpretation on 08-01-2018 at 2:03 am  to Dr. Brantley Stage , who verbally acknowledged these results. Electronically Signed   By: Lucienne Capers M.D.   On: Aug 01, 2018 02:12   Ct Cervical Spine Wo Contrast  Result Date: 2018-08-01 CLINICAL DATA:  Fall. EXAM: CT HEAD WITHOUT CONTRAST CT CERVICAL SPINE WITHOUT CONTRAST TECHNIQUE: Multidetector CT imaging of the head and cervical spine was performed following the standard protocol without intravenous contrast. Multiplanar CT image reconstructions of the cervical spine were also generated. COMPARISON:  None. FINDINGS: CT HEAD FINDINGS Brain: There is acute intracranial subdural hematoma in the right frontotemporoparietal region measuring 2.7 cm maximal depth. Severe right to left midline shift measuring about 2.5  cm. Sub fall seen herniation. There is effacement of sulci and right lateral ventricle. Gray-white matter junctions remain distinct. Basal cisterns are effaced consistent with transtentorial herniation. Vascular: Moderate intracranial arterial vascular calcifications. Skull: Calvarium appears intact. No acute depressed skull fractures. Sinuses/Orbits: Paranasal sinuses and mastoid air cells are clear. Other: Moderate subcutaneous scalp hematoma over the right parietal region. CT CERVICAL SPINE FINDINGS Alignment: Slight anterior subluxation of C5 on C6 is nonspecific but probably degenerative. Normal alignment of the facet joints. C1-2 articulation appears intact. Skull base and vertebrae: Skull base appears intact. No vertebral compression deformities. No focal bone lesion or bone destruction. Bone cortex appears intact. Soft tissues and spinal canal: No prevertebral soft tissue swelling. No abnormal paraspinal soft tissue mass or infiltration. Disc levels: Intervertebral disc calcification and endplate hypertrophic changes consistent with degenerative change. Degenerative changes at C1-2. Degenerative changes in the facet joints. Upper chest: Motion artifact limits examination. No gross  opacification. Other: None. IMPRESSION: 1. Acute intracranial subdural hematoma in the right frontotemporoparietal region with severe mass effect causing 2 point 5 cm right to left midline shift and subfalcine and transtentorial herniation. 2. Degenerative changes in the cervical spine. No acute displaced fractures identified. These results were called by telephone at the time of interpretation on 08/12/18 at 2:03 am to Dr. Brantley Stage , who verbally acknowledged these results. Electronically Signed   By: Lucienne Capers M.D.   On: Aug 12, 2018 02:12   Dg Pelvis Portable  Result Date: Aug 12, 2018 CLINICAL DATA:  Fall. EXAM: PORTABLE PELVIS 1-2 VIEWS COMPARISON:  None. FINDINGS: There is no evidence of pelvic fracture or diastasis. No pelvic bone lesions are seen. Degenerative changes in the lower lumbar spine and hips. Calcific densities projected over the right lateral pelvis, likely injection sites. IMPRESSION: No acute bony abnormalities. Electronically Signed   By: Lucienne Capers M.D.   On: 08/12/18 02:19   Dg Chest Port 1 View  Result Date: 2018/08/12 CLINICAL DATA:  Fall EXAM: PORTABLE CHEST 1 VIEW COMPARISON:  None. FINDINGS: Moderate cardiomegaly. No focal airspace consolidation or pulmonary edema. IMPRESSION: No active disease. Electronically Signed   By: Ulyses Jarred M.D.   On: 08/12/18 02:18    Procedures Procedures (including critical care time)  Medications Ordered in ED Medications  morphine 2 MG/ML injection 2 mg (has no administration in time range)  ondansetron (ZOFRAN) injection 4 mg (has no administration in time range)     Initial Impression / Assessment and Plan / ED Course  I have reviewed the triage vital signs and the nursing notes.  Pertinent labs & imaging results that were available during my care of the patient were reviewed by me and considered in my medical decision making (see chart for details).     Patient brought to the ER after a fall.  She has had  precipitous decline over the 4 hours since she fell.  It was felt at arrival based on her examination that she either had a significant injury from the fall or had a spontaneous hemorrhage that caused the fall.  Based on her age I felt it was appropriate to obtain a CAT scan immediately to determine pathology.  CAT scan did show a large subdural hematoma with mass-effect and midline shift.  Patient's daughter and multiple family members are present in the ER at her arrival.  Multiple discussions were held with multiple family members.  Each family member, including her daughter, stated that she would never want to be on any machines or have her  life extended.  She would not want surgeries.  She would have wanted to "go peacefully".  There will therefore be no consultation for neurosurgery.  She will be monitored here in the ER for a period of time to see if she passes quickly, otherwise will require hospitalization for comfort measures.  Addendum: Patient had progressively been exhibiting decreasing blood pressures, decreasing heart rate and decreasing oxygen saturation.  Patient declared dead at 7.  Izora Ribas, medical examiner contacted.  Patient okay to be sent to the morgue, will be evaluated by medical examiner.  CRITICAL CARE Performed by: Orpah Greek   Total critical care time: 35 minutes  Critical care time was exclusive of separately billable procedures and treating other patients.  Critical care was necessary to treat or prevent imminent or life-threatening deterioration.  Critical care was time spent personally by me on the following activities: development of treatment plan with patient and/or surrogate as well as nursing, discussions with consultants, evaluation of patient's response to treatment, examination of patient, obtaining history from patient or surrogate, ordering and performing treatments and interventions, ordering and review of laboratory studies, ordering and  review of radiographic studies, pulse oximetry and re-evaluation of patient's condition.   Final Clinical Impressions(s) / ED Diagnoses   Final diagnoses:  SDH (subdural hematoma) [facility])    ED Discharge Orders    None       Kaspian Muccio, Gwenyth Allegra, MD August 06, 2018 [id number]    Orpah Greek, MD August 06, 2018 [phone]

## 2018-08-12 NOTE — ED Notes (Signed)
Family and chaplin at bedside

## 2018-08-12 NOTE — ED Notes (Signed)
Time of death 39

## 2018-08-12 NOTE — Progress Notes (Signed)
I responded to a Level 1 Trauma page from the ED. I visited [facility] and assisted the family as they visited with the patient. I provided spiritual support through pastoral presence, reading Scripture, and by leading in prayer. I remained present with the family and provided spiritual care as needed.    2018/07/27 0300  Clinical Encounter Type  Visited With Patient and family together  Visit Type Spiritual support;Code;ED  Referral From Nurse  Consult/Referral To Chaplain  Spiritual Encounters  Spiritual Needs Prayer;Emotional  Stress Factors  Patient Stress Factors None identified  Family Stress Factors Exhausted    Chaplain Dr Redgie Grayer

## 2018-08-12 DEATH — deceased

## 2019-09-03 IMAGING — CR DG CHEST 2V
2 series · 2 of 2 positions shown · non-contrast
Comparison: 07/10/2017

CLINICAL DATA: Left side chest pain

EXAM:
CHEST - 2 VIEW

[w chest lat]
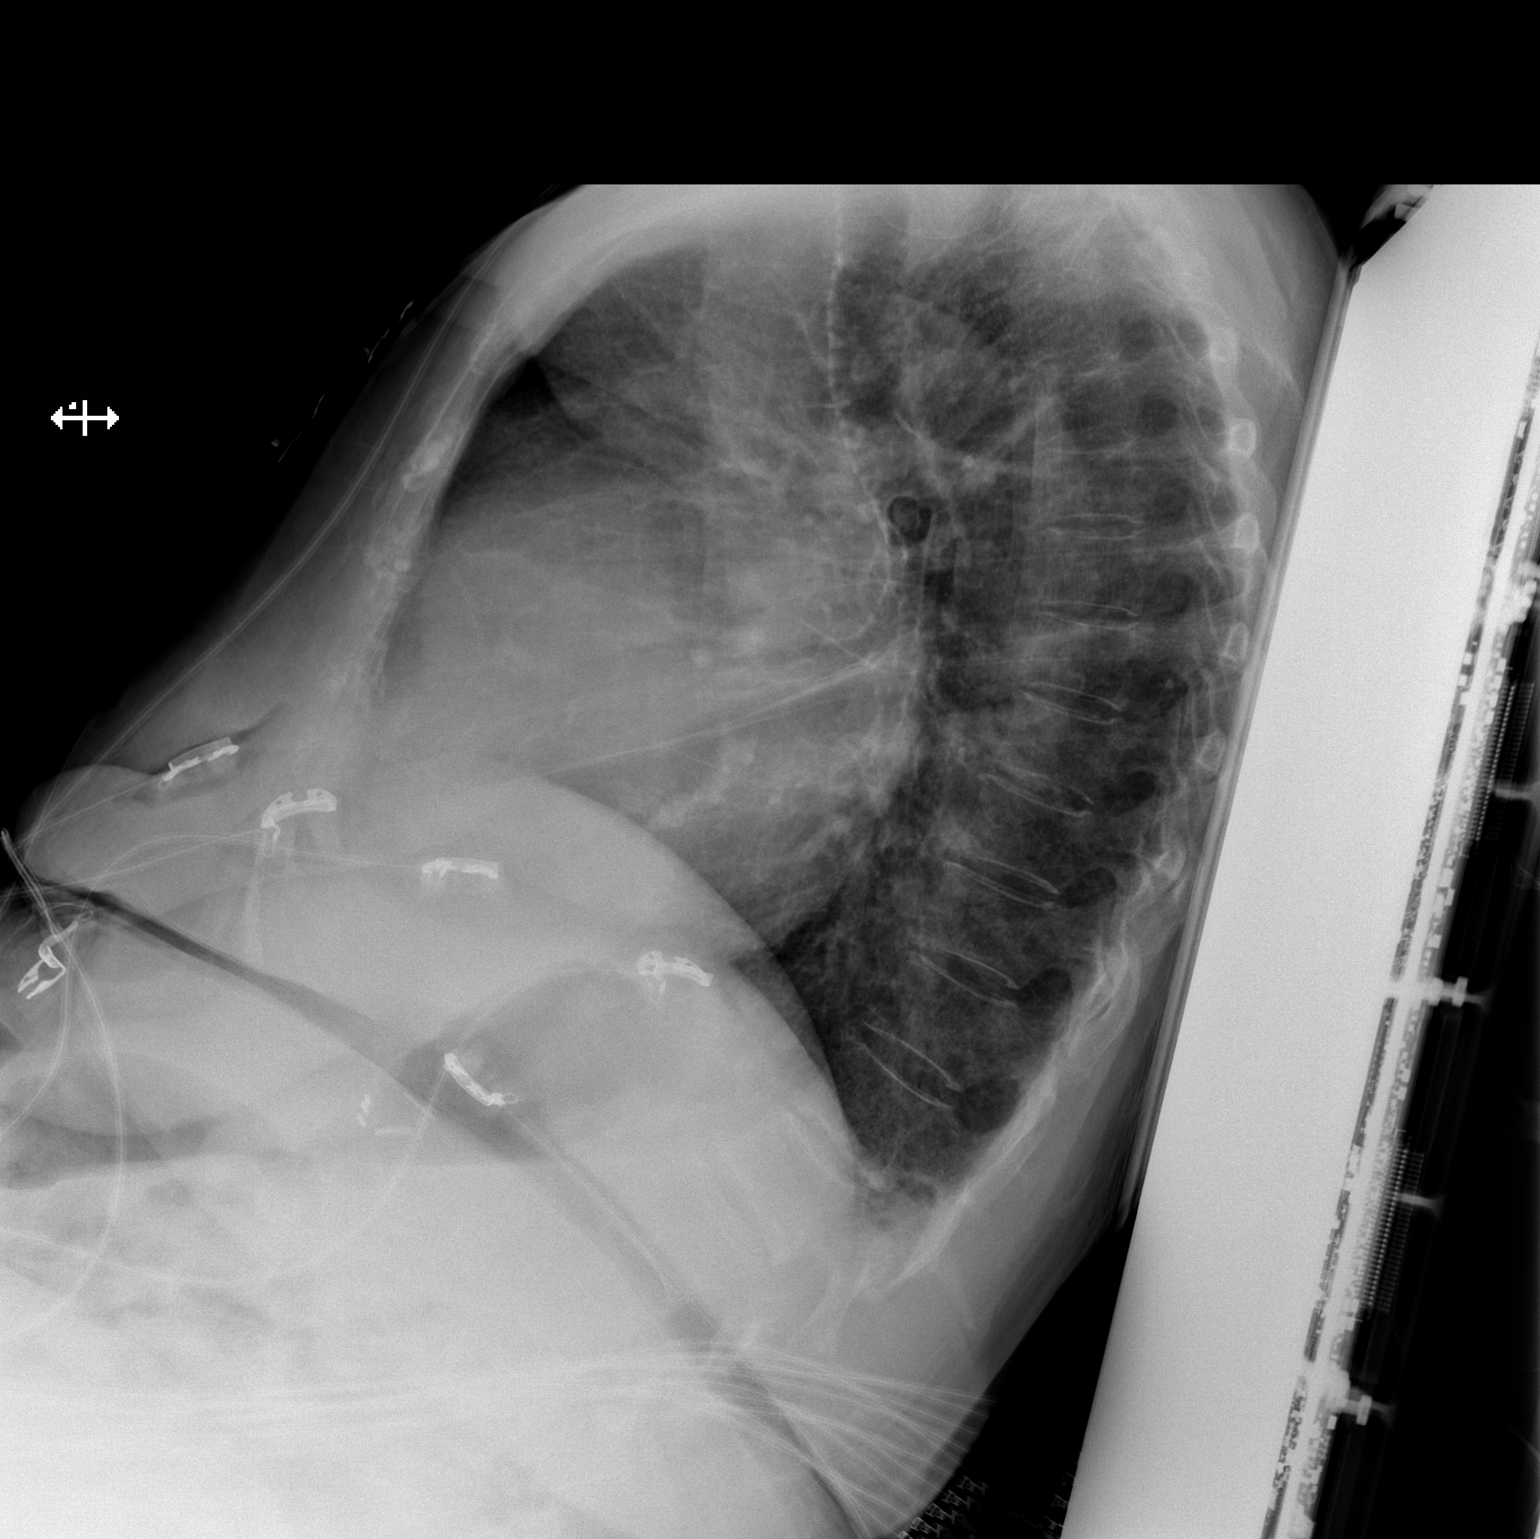

[x chest ap]
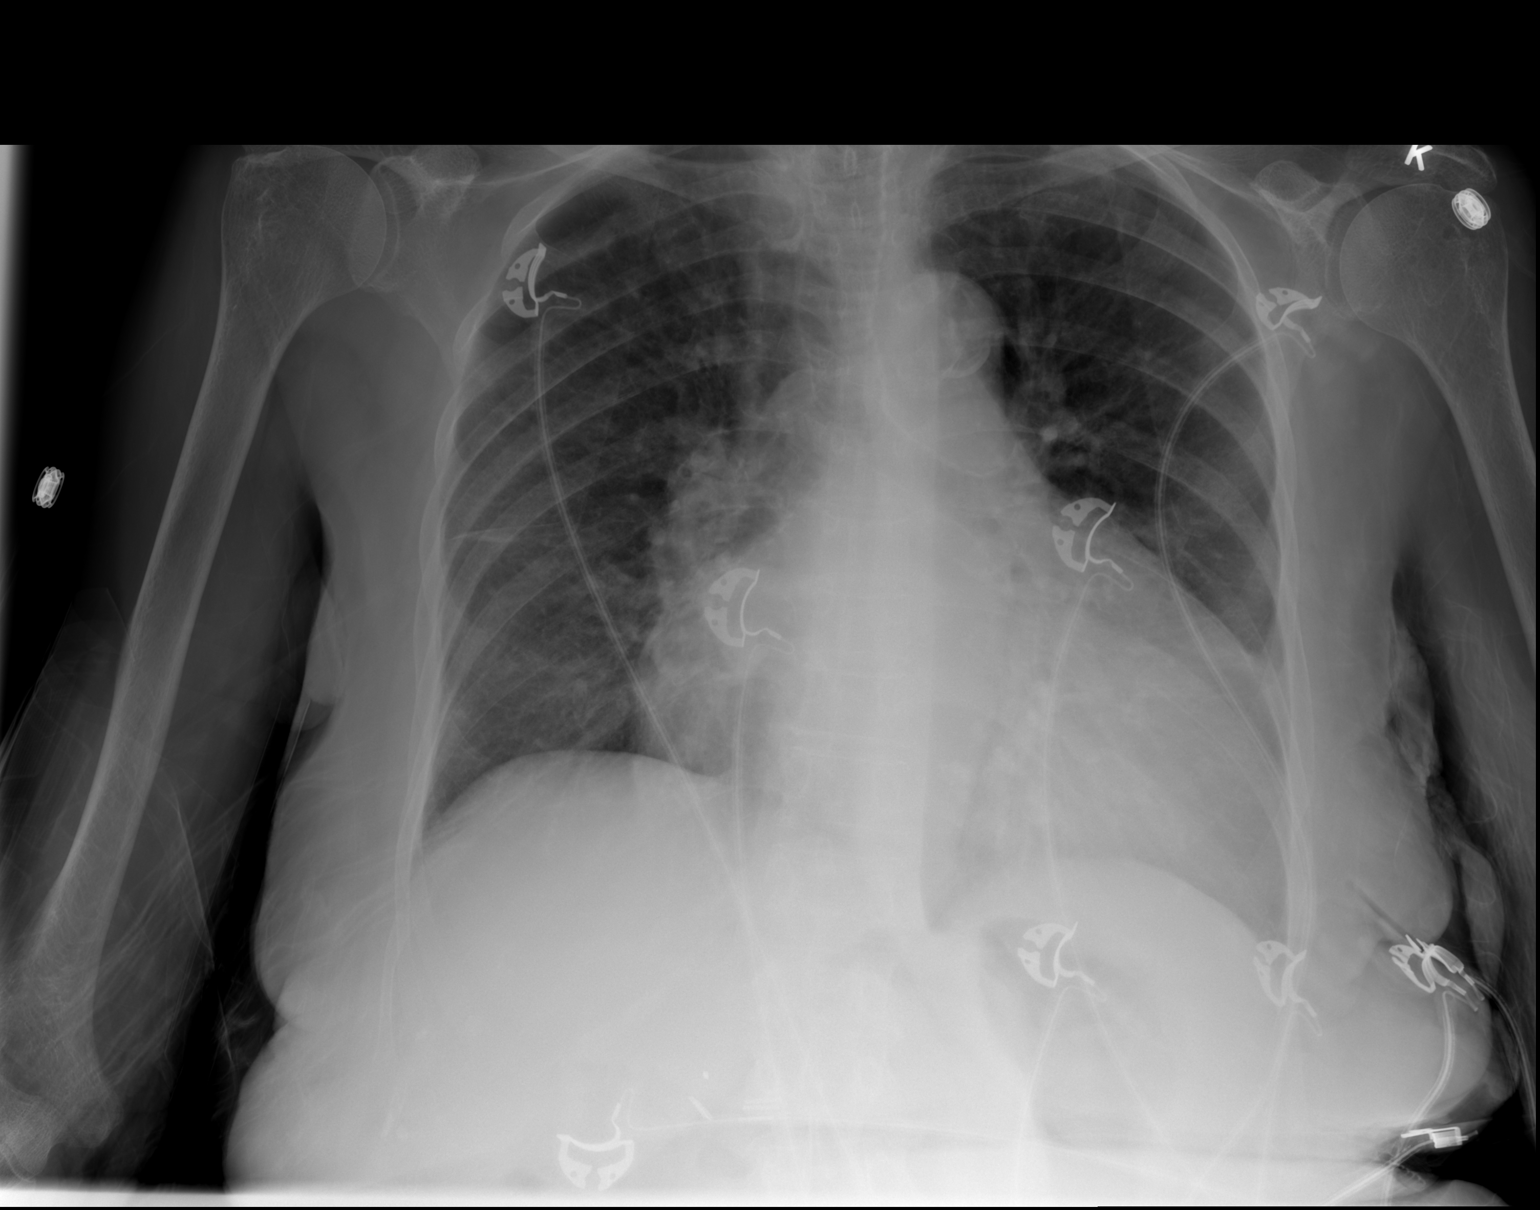

[2 of 2 positions shown; findings below may reference images not displayed]

FINDINGS: Cardiomegaly. No overt edema. No confluent opacities or effusions.
No acute bony abnormality.
IMPRESSION: Cardiomegaly.  No active disease.
